# Patient Record
Sex: Female | Born: 1990 | Race: White | Hispanic: No | Marital: Married | Smoking: Never smoker
Health system: Southern US, Community
[De-identification: ages and names within clinical notes are randomized; demographics above are authoritative.]

## PROBLEM LIST (undated history)

## (undated) ENCOUNTER — Inpatient Hospital Stay (HOSPITAL_COMMUNITY): Payer: Self-pay

## (undated) DIAGNOSIS — O1490 Unspecified pre-eclampsia, unspecified trimester: Secondary | ICD-10-CM

## (undated) DIAGNOSIS — N390 Urinary tract infection, site not specified: Secondary | ICD-10-CM

---

## 2012-10-25 LAB — OB RESULTS CONSOLE ABO/RH: RH Type: POSITIVE

## 2012-10-25 LAB — OB RESULTS CONSOLE HEPATITIS B SURFACE ANTIGEN: Hepatitis B Surface Ag: NEGATIVE

## 2012-10-25 LAB — OB RESULTS CONSOLE HIV ANTIBODY (ROUTINE TESTING): HIV: NONREACTIVE

## 2013-02-13 DIAGNOSIS — O30039 Twin pregnancy, monochorionic/diamniotic, unspecified trimester: Secondary | ICD-10-CM | POA: Insufficient documentation

## 2013-02-26 ENCOUNTER — Other Ambulatory Visit: Payer: Self-pay

## 2013-03-17 ENCOUNTER — Encounter (HOSPITAL_COMMUNITY): Payer: Self-pay | Admitting: *Deleted

## 2013-03-17 ENCOUNTER — Inpatient Hospital Stay (HOSPITAL_COMMUNITY)
Admission: AD | Admit: 2013-03-17 | Discharge: 2013-03-17 | Disposition: A | Discharge status: Home / Self Care | Payer: Managed Care, Other (non HMO) | Source: Ambulatory Visit | Attending: Obstetrics and Gynecology | Admitting: Obstetrics and Gynecology

## 2013-03-17 DIAGNOSIS — O99891 Other specified diseases and conditions complicating pregnancy: Secondary | ICD-10-CM | POA: Insufficient documentation

## 2013-03-17 DIAGNOSIS — O212 Late vomiting of pregnancy: Secondary | ICD-10-CM | POA: Insufficient documentation

## 2013-03-17 DIAGNOSIS — O30009 Twin pregnancy, unspecified number of placenta and unspecified number of amniotic sacs, unspecified trimester: Secondary | ICD-10-CM | POA: Insufficient documentation

## 2013-03-17 DIAGNOSIS — R42 Dizziness and giddiness: Secondary | ICD-10-CM | POA: Insufficient documentation

## 2013-03-17 HISTORY — DX: Urinary tract infection, site not specified: N39.0

## 2013-03-17 LAB — URINALYSIS, ROUTINE W REFLEX MICROSCOPIC
Hgb urine dipstick: NEGATIVE
Protein, ur: NEGATIVE mg/dL
Urobilinogen, UA: 0.2 mg/dL (ref 0.0–1.0)

## 2013-03-17 MED ORDER — LACTATED RINGERS IV BOLUS (SEPSIS)
1000.0000 mL | Freq: Once | INTRAVENOUS | Status: AC
  Administered 2013-03-17: 1000 mL via INTRAVENOUS

## 2013-03-17 NOTE — MAU Provider Note (Signed)
History     CSN: [id number]  Arrival date and time: 03/17/13 1134   First Provider Initiated Contact with Patient 03/17/13 1222      Chief Complaint  Patient presents with  . Nausea   HPI 22 y.o. G1P0 at [redacted]w[redacted]d with twins sent from office for IV hydration. Pt states she has been feeling weak, dizzy and nauseous lately and describes some near syncopal episodes today. States she ate a good breakfast, hamburger for lunch. Hgb 12 in the office today. Denies pain or bleeding, + fetal movement. States she feels worse when the babies move and with any position changes.   Past Medical History  Diagnosis Date  . UTI (lower urinary tract infection)     History reviewed. No pertinent past surgical history.  History reviewed. No pertinent family history.  History  Substance Use Topics  . Smoking status: Never Smoker   . Smokeless tobacco: Never Used  . Alcohol Use: Not on file    Allergies: No Known Allergies  Prescriptions prior to admission  Medication Sig Dispense Refill  . ondansetron (ZOFRAN) 8 MG tablet Take 8 mg by mouth daily as needed for nausea.      . Prenatal Vit-Fe Fumarate-FA (PRENATAL MULTIVITAMIN) TABS Take 1 tablet by mouth daily at 12 noon.      . tolnaftate (TING) 1 % cream Apply 1 application topically 2 (two) times daily as needed (for athletes foot).        Review of Systems  Respiratory: Negative.   Cardiovascular: Negative.   Gastrointestinal: Negative for nausea, vomiting, abdominal pain, diarrhea and constipation.  Genitourinary: Negative for dysuria, urgency, frequency, hematuria and flank pain.       Negative for vaginal bleeding, cramping/contractions  Musculoskeletal: Negative.   Neurological: Positive for dizziness and weakness.  Psychiatric/Behavioral: Negative.    Physical Exam   Blood pressure 131/78, pulse 104, temperature 97.9 F (36.6 C), temperature source Oral, resp. rate 18, height 5' (1.524 m), weight 127 lb 12.8 oz (57.97 kg), last  menstrual period 08/11/2012.  Physical Exam  Nursing note and vitals reviewed. Constitutional: She is oriented to person, place, and time. She appears well-developed and well-nourished. No distress.  Cardiovascular: Normal rate.   Respiratory: Effort normal.  GI: Soft. There is no tenderness.  Musculoskeletal: Normal range of motion.  Neurological: She is alert and oriented to person, place, and time.  Skin: Skin is warm and dry.  Psychiatric: She has a normal mood and affect.   Reactive NST x 2, irregular UC MAU Course  Procedures  Results for orders placed during the hospital encounter of 03/17/13 (from the past 24 hour(s))  URINALYSIS, ROUTINE W REFLEX MICROSCOPIC     Status: Abnormal   Collection Time    03/17/13 12:46 PM      Result Value Range   Color, Urine YELLOW  YELLOW   APPearance CLEAR  CLEAR   Specific Gravity, Urine <1.005 (*) 1.005 - 1.030   pH 7.0  5.0 - 8.0   Glucose, UA NEGATIVE  NEGATIVE mg/dL   Hgb urine dipstick NEGATIVE  NEGATIVE   Bilirubin Urine NEGATIVE  NEGATIVE   Ketones, ur NEGATIVE  NEGATIVE mg/dL   Protein, ur NEGATIVE  NEGATIVE mg/dL   Urobilinogen, UA 0.2  0.0 - 1.0 mg/dL   Nitrite NEGATIVE  NEGATIVE   Leukocytes, UA NEGATIVE  NEGATIVE   IV hydration with 2 liters LR. Still feeling lightheaded.   Patient Vitals for the past 24 hrs:  BP Temp Temp src  Pulse Resp Height Weight  03/17/13 1440 110/72 mmHg - - 109 18 - -  03/17/13 1424 110/72 mmHg - - 109 - - -  03/17/13 1423 122/72 mmHg - - 102 - - -  03/17/13 1421 131/78 mmHg - - 104 18 - -  03/17/13 1158 118/78 mmHg 97.9 F (36.6 C) Oral 48 18 - -  03/17/13 1147 - - - - - 5' (1.524 m) 127 lb 12.8 oz (57.97 kg)    Assessment and Plan   1. Postural lightheadedness   2. Twin pregnancy, antepartum   Recommend compression stockings, frequent high protein snacks, aggressive PO hydration and rest. Call office if symptoms not improving over the next couple of days.     Medication List          ondansetron 8 MG tablet  Commonly known as:  ZOFRAN  Take 8 mg by mouth daily as needed for nausea.     prenatal multivitamin Tabs  Take 1 tablet by mouth daily at 12 noon.     TING 1 % cream  Generic drug:  tolnaftate  Apply 1 application topically 2 (two) times daily as needed (for athletes foot).            Follow-up Information   Follow up with GREWAL,MICHELLE L, MD. (as scheduled or sooner if symptoms do not improve)    Contact information:   [address] [place] [address] [phone]         FRAZIER,NATALIE 03/17/2013, 2:23 PM

## 2013-03-17 NOTE — MAU Note (Signed)
Patient was sent from the office for IVF's. States she has been nauseated and feeling dizzy.

## 2013-03-28 ENCOUNTER — Ambulatory Visit (INDEPENDENT_AMBULATORY_CARE_PROVIDER_SITE_OTHER): Payer: Managed Care, Other (non HMO) | Admitting: *Deleted

## 2013-03-28 VITALS — BP 117/78

## 2013-03-28 DIAGNOSIS — O30039 Twin pregnancy, monochorionic/diamniotic, unspecified trimester: Secondary | ICD-10-CM

## 2013-03-28 DIAGNOSIS — O30033 Twin pregnancy, monochorionic/diamniotic, third trimester: Secondary | ICD-10-CM

## 2013-03-28 DIAGNOSIS — O30009 Twin pregnancy, unspecified number of placenta and unspecified number of amniotic sacs, unspecified trimester: Secondary | ICD-10-CM

## 2013-03-28 NOTE — Progress Notes (Signed)
P = 100  Copy of report and tracing sent w/pt to Dr. Henderson Cloud today.

## 2013-04-04 ENCOUNTER — Other Ambulatory Visit: Payer: Managed Care, Other (non HMO)

## 2013-04-07 ENCOUNTER — Ambulatory Visit (INDEPENDENT_AMBULATORY_CARE_PROVIDER_SITE_OTHER): Payer: Managed Care, Other (non HMO) | Admitting: *Deleted

## 2013-04-07 DIAGNOSIS — O30009 Twin pregnancy, unspecified number of placenta and unspecified number of amniotic sacs, unspecified trimester: Secondary | ICD-10-CM

## 2013-04-15 ENCOUNTER — Other Ambulatory Visit: Payer: Managed Care, Other (non HMO)

## 2013-04-16 ENCOUNTER — Other Ambulatory Visit: Payer: Managed Care, Other (non HMO)

## 2013-04-16 ENCOUNTER — Ambulatory Visit (INDEPENDENT_AMBULATORY_CARE_PROVIDER_SITE_OTHER): Payer: Managed Care, Other (non HMO) | Admitting: *Deleted

## 2013-04-16 VITALS — BP 122/72

## 2013-04-16 DIAGNOSIS — O30009 Twin pregnancy, unspecified number of placenta and unspecified number of amniotic sacs, unspecified trimester: Secondary | ICD-10-CM

## 2013-04-16 LAB — OB RESULTS CONSOLE GBS: GBS: NEGATIVE

## 2013-04-16 NOTE — Progress Notes (Signed)
P = 79   Copy of report and tracing sent to Dr. Marcelle Overlie w/pt today.

## 2013-04-22 ENCOUNTER — Other Ambulatory Visit (HOSPITAL_COMMUNITY): Payer: Self-pay | Admitting: Obstetrics and Gynecology

## 2013-04-22 DIAGNOSIS — Z3689 Encounter for other specified antenatal screening: Secondary | ICD-10-CM

## 2013-04-23 ENCOUNTER — Encounter (HOSPITAL_COMMUNITY): Payer: Self-pay

## 2013-04-23 ENCOUNTER — Ambulatory Visit (HOSPITAL_COMMUNITY)
Admission: RE | Admit: 2013-04-23 | Discharge: 2013-04-23 | Disposition: A | Discharge status: Home / Self Care | Payer: Managed Care, Other (non HMO) | Source: Ambulatory Visit | Attending: Obstetrics and Gynecology | Admitting: Obstetrics and Gynecology

## 2013-04-23 ENCOUNTER — Other Ambulatory Visit (HOSPITAL_COMMUNITY): Payer: Self-pay | Admitting: Obstetrics and Gynecology

## 2013-04-23 ENCOUNTER — Other Ambulatory Visit: Payer: Managed Care, Other (non HMO)

## 2013-04-23 VITALS — BP 130/82 | HR 87 | Wt 139.0 lb

## 2013-04-23 DIAGNOSIS — O30039 Twin pregnancy, monochorionic/diamniotic, unspecified trimester: Secondary | ICD-10-CM | POA: Insufficient documentation

## 2013-04-23 DIAGNOSIS — Z3689 Encounter for other specified antenatal screening: Secondary | ICD-10-CM | POA: Insufficient documentation

## 2013-04-23 DIAGNOSIS — O30009 Twin pregnancy, unspecified number of placenta and unspecified number of amniotic sacs, unspecified trimester: Secondary | ICD-10-CM | POA: Insufficient documentation

## 2013-04-23 IMAGING — US US OB COMP +14 WK
1 series · 14 of 28 positions shown · non-contrast
Comparison: none

[Series 1: us ob comp +14 wk · 0.19mm/px · 85 acquisitions, 14 frames shown]
[im 4/85]
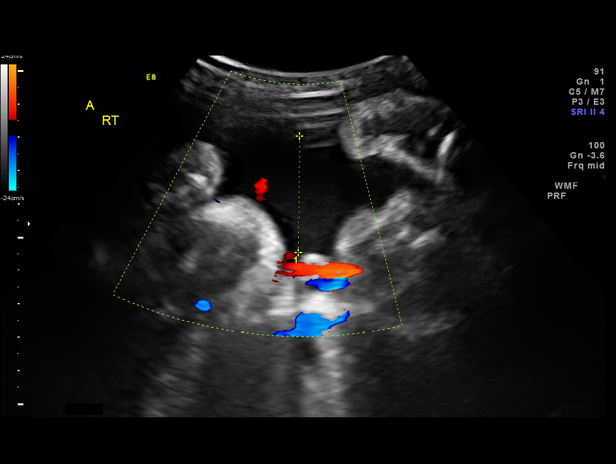
[im 10/85]
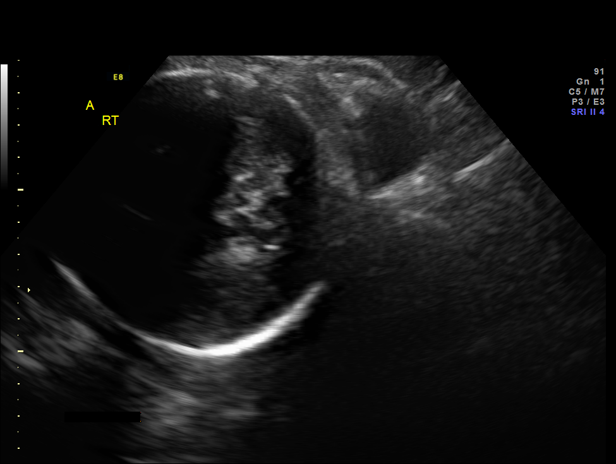
[im 16/85]
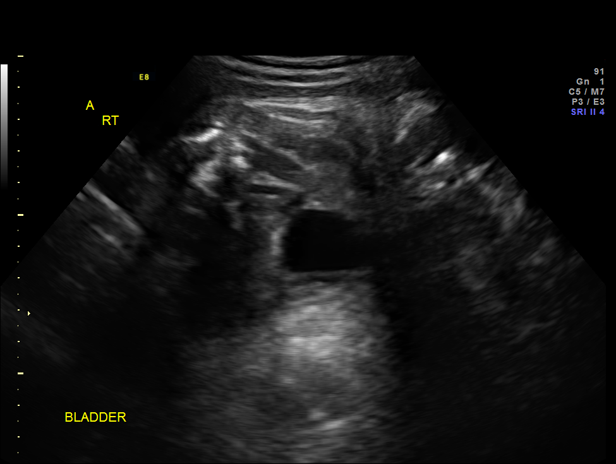
[im 22/85]
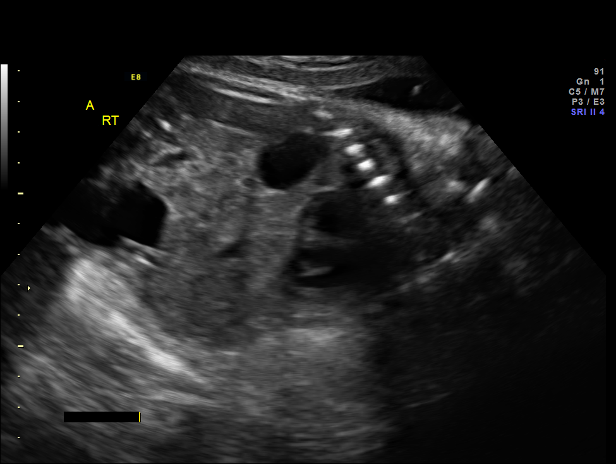
[im 29/85]
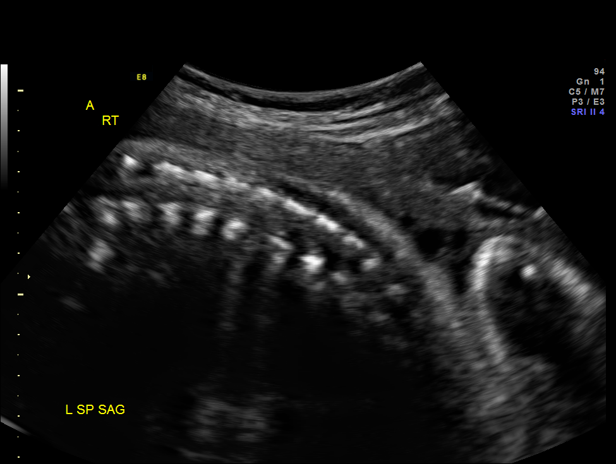
[im 35/85]
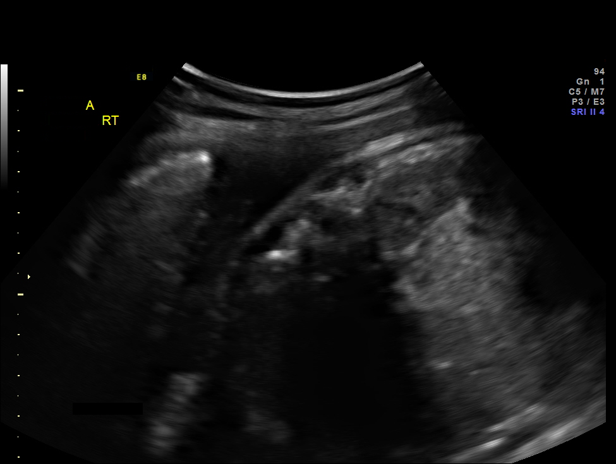
[im 41/85]
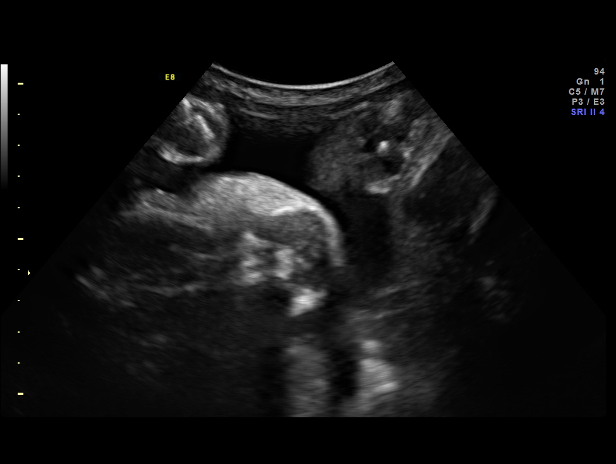
[im 47/85]
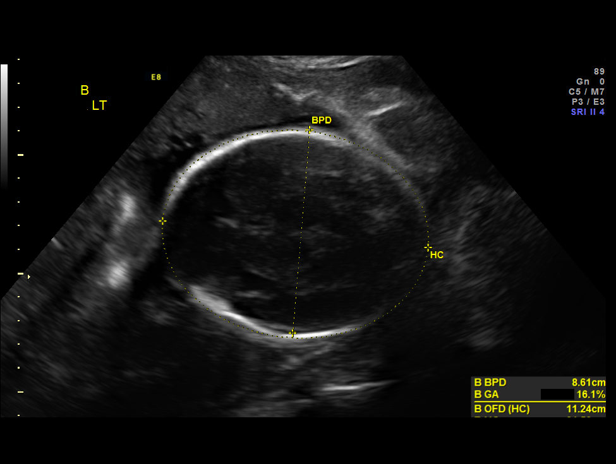
[im 53/85]
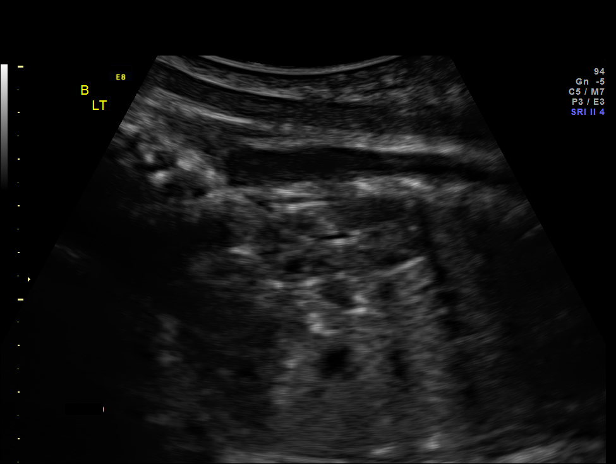
[im 60/85]
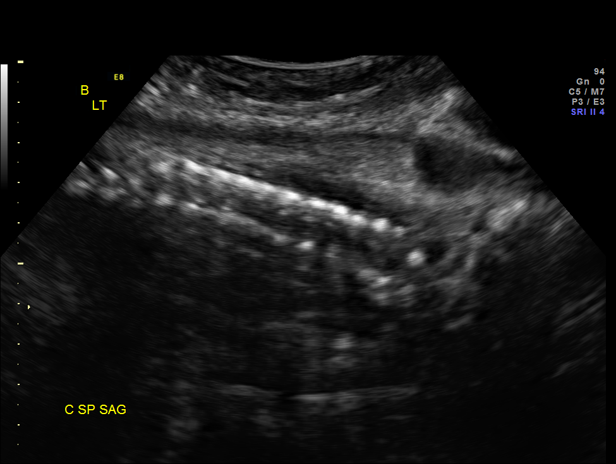
[im 66/85]
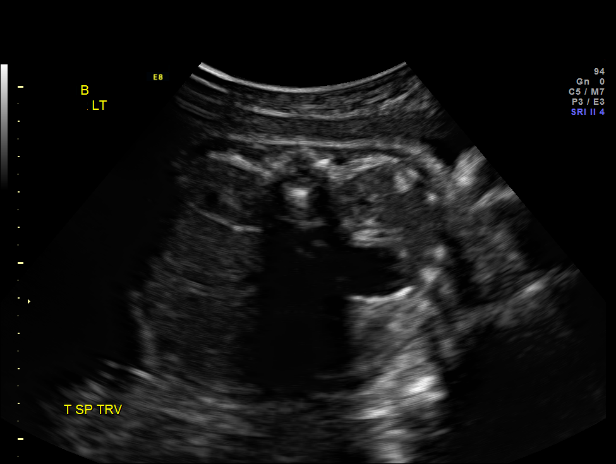
[im 72/85]
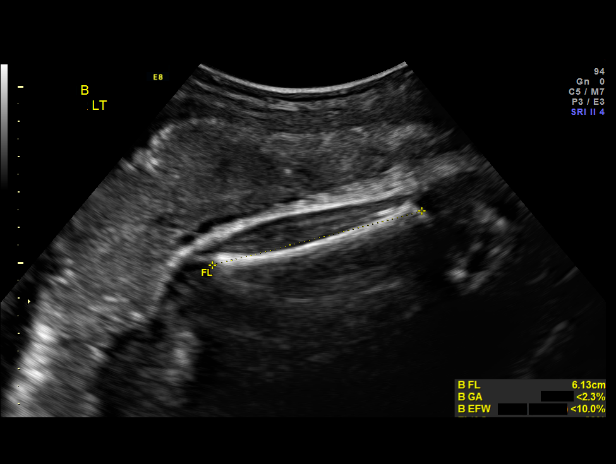
[im 78/85]
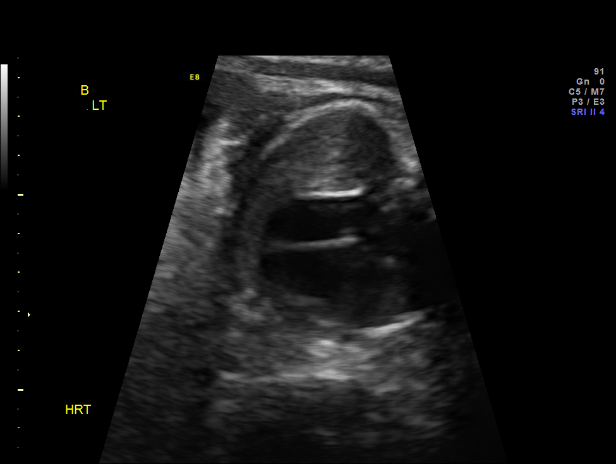
[im 85/85]
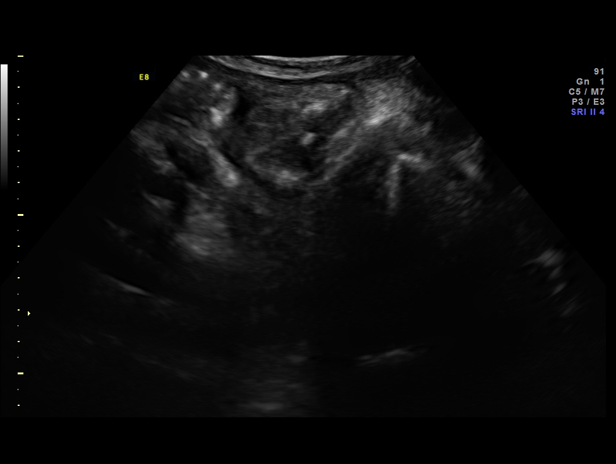

[14 of 28 positions shown; findings below may reference images not displayed]

OBSTETRICS REPORT
                      (Signed Final [DATE] [DATE])

Service(s) Provided

 US OB COMP + 14 WK                                    76805.1
 US OB COMP ADDL GEST + 14 WK                          76810.1
 US UA CORD DOPPLER                                    76820.0
 US UA ADDL GEST                                       76820.1
Indications

 Basic anatomic survey                                 [[place]]
 MIYANUI pregnancy, twins discordant,
 antepartum
Fetal Evaluation (Fetus A)

 Num Of Fetuses:    2
 Fetal Heart Rate:  144                          bpm
 Cardiac Activity:  Observed
 Fetal Lie:         Lower right Fetus
 Presentation:      Cephalic
 Placenta:          Fundal

 Membrane Desc:     Dividing
                    Membrane seen

 Amniotic Fluid
 AFI FV:      Subjectively within normal limits
                                             Larg Pckt:     3.5  cm
Biometry (Fetus A)

 BPD:     87.3  mm     G. Age:  35w 2d                CI:         86.7   70 - 86
 OFD:    100.7  mm                                    FL/HC:      20.9   20.1 -

 HC:     301.3  mm     G. Age:  33w 3d      < 3  %    HC/AC:      0.95   0.93 -

 AC:     316.8  mm     G. Age:  35w 4d       38  %    FL/BPD:     72.1   71 - 87
 FL:      62.9  mm     G. Age:  32w 4d      < 3  %    FL/AC:      19.9   20 - 24
 HUM:     58.4  mm     G. Age:  33w 6d       20  %

 Est. FW:    [WV]  gm      5 lb 7 oz     22  %     FW Discordancy     0 \ 19 %
Gestational Age (Fetus A)

 LMP:           36w 3d        Date:  [DATE]                 EDD:   [DATE]
 U/S Today:     34w 2d                                        EDD:   [DATE]
 Best:          36w 3d     Det. By:  LMP  ([DATE])          EDD:   [DATE]
Anatomy (Fetus A)

 Cranium:          Appears normal         Diaphragm:        Appears normal
 Fetal Cavum:      Not well visualized    Stomach:          Appears normal
 Ventricles:       Appears normal         Abdomen:          Appears normal
 Choroid Plexus:   Appears normal         Abdominal Wall:   Not well visualized
 Cerebellum:       Not well visualized    Cord Vessels:     Appears normal (3
                                                            vessel cord)
 Posterior Fossa:  Not well visualized    Kidneys:          Appear normal
 Nuchal Fold:      Not applicable (>20    Bladder:          Appears normal
                   wks GA)
 Face:             Not well visualized    Spine:            Limited views
                                                            appear normal
 Lips:             Not well visualized    Lower             Not well visualized
                                          Extremities:
 Heart:            Not well visualized    Upper             Not well visualized
                                          Extremities:
 Aortic Arch:      Appears normal

 Other:  Technically difficult due to advanced GA and fetal position.
Doppler - Fetal Vessels (Fetus A)

 Umbilical Artery
 S/D:   2.55           59  %tile       RI:
 PI:    0.91                           PSV:       49.37   cm/s
 Umbilical Artery
 Absent DFV:    No     Reverse DFV:    No

Fetal Evaluation (Fetus B)

 Num Of Fetuses:    2
 Fetal Heart Rate:  138                          bpm
 Cardiac Activity:  Observed
 Fetal Lie:         Upper left Fetus
 Presentation:      Cephalic
 Placenta:          Fundal

 Membrane Desc:     Dividing
                    Membrane seen

 Amniotic Fluid
 AFI FV:      Subjectively within normal limits
                                             Larg Pckt:     3.0  cm
Biometry (Fetus B)

 BPD:     86.1  mm     G. Age:  34w 5d                CI:         76.1   70 - 86
 OFD:    113.2  mm                                    FL/HC:      19.6   20.1 -

 HC:     316.7  mm     G. Age:  35w 4d        9  %    HC/AC:      1.14   0.93 -

 AC:       277  mm     G. Age:  31w 6d      < 3  %    FL/BPD:     72.2   71 - 87
 FL:      62.2  mm     G. Age:  32w 1d      < 3  %    FL/AC:      22.5   20 - 24
 HUM:     56.5  mm     G. Age:  32w 6d      < 5  %

 Est. FW:    [WV]  gm      4 lb 6 oz   < 10  %     FW Discordancy        19  %
Gestational Age (Fetus B)

 LMP:           36w 3d        Date:  [DATE]                 EDD:   [DATE]
 U/S Today:     33w 4d                                        EDD:   [DATE]
 Best:          36w 3d     Det. By:  LMP  ([DATE])          EDD:   [DATE]
Anatomy (Fetus B)

 Cranium:          Appears normal         Diaphragm:        Not well visualized
 Fetal Cavum:      Appears normal         Stomach:          Appears normal
 Ventricles:       Not well visualized    Abdomen:          Appears normal
 Choroid Plexus:   Not well visualized    Abdominal Wall:   Not well visualized
 Cerebellum:       Not well visualized    Cord Vessels:     Not well visualized
 Posterior Fossa:  Not well visualized    Kidneys:          Appear normal
 Nuchal Fold:      Not applicable (>20    Bladder:          Appears normal
                   wks GA)
 Face:             Not well visualized    Spine:            Limited views
                                                            appear normal
 Lips:             Not well visualized    Lower             Not well visualized
                                          Extremities:
 Heart:            Not well visualized    Upper             Not well visualized
                                          Extremities:
 Aortic Arch:      Appears normal

 Other:  Technically difficult due to advanced GA and fetal position.
Doppler - Fetal Vessels (Fetus B)

 Umbilical Artery
 S/D:   2.24           41  %tile       RI:
 PI:    0.81                           PSV:       37.27   cm/s
 Umbilical Artery
 Absent DFV:    No     Reverse DFV:    No

Cervix Uterus Adnexa

 Cervix:       Not visualized (advanced GA >[WV])
Impression

 MC/DA twins with best dates of 36 [DATE] weeks
 19% intertwin growth discordance is noted

 Twin A:
 Maternal right, cephalic presentaton, fundal placenta
 Fetal growth is appropriate (22nd %tile)
 Limited views of the fetal anatomy were obtained due to late
 gestational age
 Normal UA Doppler studies for gestational age
 Normal amniotic fluid volume

 Twin B:
 Maternal left, cephalic presentation, fundal placenta
 Estimated fetal weight < 10th %tile; AC < 3rd %tile
 Limited views of the fetal anatomy obtained due to late
 gestational age
 Normal UA Doppler studies for gestational age
 Normal amniotic fluid volume
 Reactive NST x 2
Recommendations

 Recommend induction of labor due to MC/DA twins with
 growth restriction of Twin B.
 Recommendations discussed with Dr. MIYANUI who will
 arrange delivery within the next several days.

 questions or concerns.

## 2013-04-23 NOTE — Progress Notes (Signed)
Terry Paul  was seen today for an ultrasound appointment.  See full report in [facility].  Impression: MC/DA twins with best dates of 36 3/7 weeks 19% intertwin growth discordance is noted  Twin A: Maternal right, cephalic presentaton, fundal placenta Fetal growth is appropriate (22nd %tile) Limited views of the fetal anatomy were obtained due to late gestational age Normal UA Doppler studies for gestational age Normal amniotic fluid volume  Twin B: Maternal left, cephalic presentation, fundal placenta Estimated fetal weight < 10th %tile; AC < 3rd %tile Limited views of the fetal anatomy obtained due to late gestational age Normal UA Doppler studies for gestational age Normal amniotic fluid volume  Reactive NST x 2  Recommendations: Recommend induction of labor due to MC/DA twins with growth restriction of Twin B. Recommendations discussed with Dr. Marcelle Overlie who will arrange delivery within the next several days  Alpha Gula, MD

## 2013-04-24 ENCOUNTER — Inpatient Hospital Stay (HOSPITAL_COMMUNITY)
Admission: RE | Admit: 2013-04-24 | Discharge: 2013-05-01 | DRG: 765 | Disposition: A | Discharge status: Home / Self Care | Payer: Managed Care, Other (non HMO) | Source: Ambulatory Visit | Attending: Obstetrics and Gynecology | Admitting: Obstetrics and Gynecology

## 2013-04-24 ENCOUNTER — Encounter (HOSPITAL_COMMUNITY): Payer: Self-pay

## 2013-04-24 DIAGNOSIS — O30039 Twin pregnancy, monochorionic/diamniotic, unspecified trimester: Secondary | ICD-10-CM

## 2013-04-24 DIAGNOSIS — O30009 Twin pregnancy, unspecified number of placenta and unspecified number of amniotic sacs, unspecified trimester: Principal | ICD-10-CM | POA: Diagnosis present

## 2013-04-24 DIAGNOSIS — O30003 Twin pregnancy, unspecified number of placenta and unspecified number of amniotic sacs, third trimester: Secondary | ICD-10-CM | POA: Diagnosis present

## 2013-04-24 DIAGNOSIS — O365990 Maternal care for other known or suspected poor fetal growth, unspecified trimester, not applicable or unspecified: Secondary | ICD-10-CM | POA: Diagnosis present

## 2013-04-24 DIAGNOSIS — Z98891 History of uterine scar from previous surgery: Secondary | ICD-10-CM

## 2013-04-24 LAB — CBC
MCH: 31.4 pg (ref 26.0–34.0)
MCHC: 35.3 g/dL (ref 30.0–36.0)
Platelets: 148 10*3/uL — ABNORMAL LOW (ref 150–400)
RDW: 13.3 % (ref 11.5–15.5)

## 2013-04-24 MED ORDER — LIDOCAINE HCL (PF) 1 % IJ SOLN: 30.0000 mL | INTRAMUSCULAR | Status: DC | PRN

## 2013-04-24 MED ORDER — ACETAMINOPHEN 325 MG PO TABS
650.0000 mg | ORAL_TABLET | ORAL | Status: DC | PRN
  Administered 2013-04-25: 650 mg via ORAL
  Filled 2013-04-24 (×2): qty 1

## 2013-04-24 MED ORDER — OXYCODONE-ACETAMINOPHEN 5-325 MG PO TABS: 1.0000 | ORAL_TABLET | ORAL | Status: DC | PRN

## 2013-04-24 MED ORDER — LACTATED RINGERS IV SOLN
INTRAVENOUS | Status: DC
  Administered 2013-04-24 – 2013-04-26 (×5): via INTRAVENOUS

## 2013-04-24 MED ORDER — LACTATED RINGERS IV SOLN: 500.0000 mL | INTRAVENOUS | Status: DC | PRN

## 2013-04-24 MED ORDER — FLEET ENEMA 7-19 GM/118ML RE ENEM: 1.0000 | ENEMA | RECTAL | Status: DC | PRN

## 2013-04-24 MED ORDER — ZOLPIDEM TARTRATE 5 MG PO TABS
5.0000 mg | ORAL_TABLET | Freq: Every evening | ORAL | Status: DC | PRN
  Filled 2013-04-24: qty 1

## 2013-04-24 MED ORDER — OXYTOCIN 40 UNITS IN LACTATED RINGERS INFUSION - SIMPLE MED
62.5000 mL/h | INTRAVENOUS | Status: DC
  Filled 2013-04-24: qty 1000

## 2013-04-24 MED ORDER — TERBUTALINE SULFATE 1 MG/ML IJ SOLN: 0.2500 mg | Freq: Once | INTRAMUSCULAR | Status: AC | PRN

## 2013-04-24 MED ORDER — MISOPROSTOL 25 MCG QUARTER TABLET
25.0000 ug | ORAL_TABLET | ORAL | Status: DC | PRN
  Administered 2013-04-24 – 2013-04-25 (×2): 25 ug via VAGINAL
  Filled 2013-04-24 (×2): qty 0.25

## 2013-04-24 MED ORDER — ONDANSETRON HCL 4 MG/2ML IJ SOLN: 4.0000 mg | Freq: Four times a day (QID) | INTRAMUSCULAR | Status: DC | PRN

## 2013-04-24 MED ORDER — OXYTOCIN BOLUS FROM INFUSION: 500.0000 mL | INTRAVENOUS | Status: DC

## 2013-04-24 MED ORDER — CITRIC ACID-SODIUM CITRATE 334-500 MG/5ML PO SOLN
30.0000 mL | ORAL | Status: DC | PRN
  Administered 2013-04-26: 30 mL via ORAL
  Filled 2013-04-24: qty 15

## 2013-04-24 MED ORDER — IBUPROFEN 600 MG PO TABS: 600.0000 mg | ORAL_TABLET | Freq: Four times a day (QID) | ORAL | Status: DC | PRN

## 2013-04-25 LAB — RPR: RPR Ser Ql: NONREACTIVE

## 2013-04-25 MED ORDER — LACTATED RINGERS IV SOLN
500.0000 mL | Freq: Once | INTRAVENOUS | Status: AC
  Administered 2013-04-25: 500 mL via INTRAVENOUS

## 2013-04-25 MED ORDER — EPHEDRINE 5 MG/ML INJ
10.0000 mg | INTRAVENOUS | Status: DC | PRN
Start: 2013-04-25 — End: 2013-04-26
  Filled 2013-04-25: qty 4

## 2013-04-25 MED ORDER — OXYTOCIN 40 UNITS IN LACTATED RINGERS INFUSION - SIMPLE MED
1.0000 m[IU]/min | INTRAVENOUS | Status: DC
  Administered 2013-04-25: 2 m[IU]/min via INTRAVENOUS

## 2013-04-25 MED ORDER — EPHEDRINE 5 MG/ML INJ: 10.0000 mg | INTRAVENOUS | Status: DC | PRN

## 2013-04-25 MED ORDER — PHENYLEPHRINE 40 MCG/ML (10ML) SYRINGE FOR IV PUSH (FOR BLOOD PRESSURE SUPPORT): 80.0000 ug | PREFILLED_SYRINGE | INTRAVENOUS | Status: DC | PRN

## 2013-04-25 MED ORDER — PHENYLEPHRINE 40 MCG/ML (10ML) SYRINGE FOR IV PUSH (FOR BLOOD PRESSURE SUPPORT)
80.0000 ug | PREFILLED_SYRINGE | INTRAVENOUS | Status: DC | PRN
Start: 2013-04-25 — End: 2013-04-26
  Filled 2013-04-25: qty 5

## 2013-04-25 MED ORDER — FENTANYL 2.5 MCG/ML BUPIVACAINE 1/10 % EPIDURAL INFUSION (WH - ANES)
14.0000 mL/h | INTRAMUSCULAR | Status: DC | PRN
  Filled 2013-04-25: qty 125

## 2013-04-25 MED ORDER — TERBUTALINE SULFATE 1 MG/ML IJ SOLN: 0.2500 mg | Freq: Once | INTRAMUSCULAR | Status: AC | PRN

## 2013-04-25 MED ORDER — DIPHENHYDRAMINE HCL 50 MG/ML IJ SOLN: 12.5000 mg | INTRAMUSCULAR | Status: DC | PRN

## 2013-04-25 NOTE — Progress Notes (Signed)
Pt comfortable, feeling mild discomfort with ctx Pitocin  FHT reassuring x 2 with accels Toco Q1 Cvx FT/high  A/P:  Twins Exp mngt

## 2013-04-25 NOTE — Progress Notes (Signed)
Pt comfortable.  Mild discomfort w/ ctx, declines epidural Pitocin  FHT reassuring x 2 toco Q1-2 Cvx FT, unchanged  A/P:  Continue pitocin Exp mngt Plan of care reviewed with pt and family

## 2013-04-25 NOTE — Progress Notes (Signed)
Dr Renaldo Fiddler notifed of FHr tracing, SVE, POC, pain level, Bps, and difficulty of tracing FHRs. In to assess patient

## 2013-04-25 NOTE — H&P (Signed)
Terry Paul is a 22 y.o. female presenting for IOL.  Mono/di twins complicated by discordant growth.  [place] with MFM 2 days ago & recommended delivery. History OB History   Grav Para Term Preterm Abortions TAB SAB Ect Mult Living   1              Past Medical History  Diagnosis Date  . UTI (lower urinary tract infection)    History reviewed. No pertinent past surgical history. Family History: family history is not on file. Social History:  reports that she has never smoked. She has never used smokeless tobacco. She reports that she does not use illicit drugs. Her alcohol history is not on file.   Prenatal Transfer Tool  Maternal Diabetes: No Genetic Screening: Declined Maternal Ultrasounds/Referrals: Abnormal:  Findings:   Other:  Discordant growth Fetal Ultrasounds or other Referrals:  Referred to Materal Fetal Medicine  Maternal Substance Abuse:  No Significant Maternal Medications:  None Significant Maternal Lab Results:  None Other Comments:  None  ROS  Dilation: 1 Effacement (%): Thick Station: -1 Exam by:: N Psychologist, counselling Blood pressure 134/89, pulse 91, temperature 97.9 F (36.6 C), temperature source Oral, resp. rate 18, height 5' (1.524 m), weight 63.05 kg (139 lb), last menstrual period 08/11/2012. Exam Physical Exam  Prenatal labs: ABO, Rh: --/--/O POS (08/07 2220) Antibody: NEG (08/07 2220) Rubella: Equivocal (02/07 0000) RPR: NON REACTIVE (08/07 2020)  HBsAg: Negative (02/07 0000)  HIV: Non-reactive (02/07 0000)  GBS: Negative (07/30 0000)   Assessment/Plan: Admit Cytotec/pitocin IOL Rec epidural anesthesia, continuous monitoring, and delivery in the OR Discussed with patient potential for c-section for both A&B or for malpresentation/distress of B after delivery of A   Aadarsh Cozort 04/25/2013, 9:48 AM

## 2013-04-25 NOTE — Progress Notes (Signed)
Dr Renaldo Fiddler notified of pt pain level, Fhr tracing, difficulty tracing Baby "B," UC freq, amt of pitocin. Order to decrease pit to 1 mmu

## 2013-04-25 NOTE — Progress Notes (Signed)
At bedside educating patient on the importance of tracing FHR while on pitocin. Patient frustrated by constant readjustment. Encouraged to find a comfortable position and trace.

## 2013-04-25 NOTE — Progress Notes (Signed)
Pt starting to feel some increase intensity with contractions.  No pain. On pitocin  FHT - reassuring x 2 Toco Q1-2 Cvx 1/80/-2, unable to arom  A/P:  Twins, IOL Continue pitocin.

## 2013-04-25 NOTE — Progress Notes (Signed)
Pt called out to ask about about slightly greater swelling in L leg than R leg; lung sounds clear, O2 sats 100%; RROB RN & MD notified - Elana Alm RNC

## 2013-04-26 ENCOUNTER — Inpatient Hospital Stay (HOSPITAL_COMMUNITY): Payer: Managed Care, Other (non HMO) | Admitting: Anesthesiology

## 2013-04-26 ENCOUNTER — Encounter (HOSPITAL_COMMUNITY): Payer: Self-pay | Admitting: Anesthesiology

## 2013-04-26 ENCOUNTER — Encounter (HOSPITAL_COMMUNITY)
Admission: RE | Disposition: A | Discharge status: Home / Self Care | Payer: Self-pay | Source: Ambulatory Visit | Attending: Obstetrics and Gynecology

## 2013-04-26 ENCOUNTER — Encounter (HOSPITAL_COMMUNITY): Payer: Self-pay

## 2013-04-26 LAB — COMPREHENSIVE METABOLIC PANEL
ALT: 8 U/L (ref 0–35)
AST: 28 U/L (ref 0–37)
Calcium: 8.5 mg/dL (ref 8.4–10.5)
GFR calc Af Amer: >90 mL/min (ref >90)
Sodium: 136 mEq/L (ref 135–145)
Total Protein: 4.4 g/dL — ABNORMAL LOW (ref 6.0–8.3)

## 2013-04-26 LAB — CBC
MCH: 31.9 pg (ref 26.0–34.0)
MCHC: 35.8 g/dL (ref 30.0–36.0)
Platelets: 130 10*3/uL — ABNORMAL LOW (ref 150–400)

## 2013-04-26 SURGERY — Surgical Case
Anesthesia: Spinal | Site: Abdomen | Wound class: Clean Contaminated

## 2013-04-26 MED ORDER — NALOXONE HCL 0.4 MG/ML IJ SOLN: 0.4000 mg | INTRAMUSCULAR | Status: DC | PRN

## 2013-04-26 MED ORDER — MENTHOL 3 MG MT LOZG: 1.0000 | LOZENGE | OROMUCOSAL | Status: DC | PRN

## 2013-04-26 MED ORDER — PROMETHAZINE HCL 25 MG/ML IJ SOLN: 6.2500 mg | INTRAMUSCULAR | Status: DC | PRN

## 2013-04-26 MED ORDER — SODIUM CHLORIDE 0.9 % IJ SOLN: 3.0000 mL | INTRAMUSCULAR | Status: DC | PRN

## 2013-04-26 MED ORDER — NALBUPHINE HCL 10 MG/ML IJ SOLN
5.0000 mg | INTRAMUSCULAR | Status: DC | PRN
  Filled 2013-04-26: qty 1

## 2013-04-26 MED ORDER — DIPHENHYDRAMINE HCL 25 MG PO CAPS: 25.0000 mg | ORAL_CAPSULE | Freq: Four times a day (QID) | ORAL | Status: DC | PRN

## 2013-04-26 MED ORDER — SCOPOLAMINE 1 MG/3DAYS TD PT72: 1.0000 | MEDICATED_PATCH | Freq: Once | TRANSDERMAL | Status: AC

## 2013-04-26 MED ORDER — SIMETHICONE 80 MG PO CHEW
80.0000 mg | CHEWABLE_TABLET | Freq: Three times a day (TID) | ORAL | Status: DC
  Administered 2013-04-26 – 2013-04-30 (×12): 80 mg via ORAL

## 2013-04-26 MED ORDER — CEFAZOLIN SODIUM-DEXTROSE 2-3 GM-% IV SOLR
2.0000 g | INTRAVENOUS | Status: AC
  Administered 2013-04-26: 2 g via INTRAVENOUS
  Filled 2013-04-26: qty 50

## 2013-04-26 MED ORDER — LACTATED RINGERS IV SOLN
INTRAVENOUS | Status: DC | PRN
  Administered 2013-04-26 (×2): via INTRAVENOUS

## 2013-04-26 MED ORDER — MORPHINE SULFATE (PF) 0.5 MG/ML IJ SOLN
INTRAMUSCULAR | Status: DC | PRN
  Administered 2013-04-26: .1 mg via INTRATHECAL

## 2013-04-26 MED ORDER — KETOROLAC TROMETHAMINE 60 MG/2ML IM SOLN
INTRAMUSCULAR | Status: AC
  Administered 2013-04-26: 60 mg via INTRAMUSCULAR
  Filled 2013-04-26: qty 2

## 2013-04-26 MED ORDER — WITCH HAZEL-GLYCERIN EX PADS: 1.0000 "application " | MEDICATED_PAD | CUTANEOUS | Status: DC | PRN

## 2013-04-26 MED ORDER — METOCLOPRAMIDE HCL 5 MG/ML IJ SOLN: 10.0000 mg | Freq: Three times a day (TID) | INTRAMUSCULAR | Status: DC | PRN

## 2013-04-26 MED ORDER — MEPERIDINE HCL 25 MG/ML IJ SOLN: 6.2500 mg | INTRAMUSCULAR | Status: DC | PRN

## 2013-04-26 MED ORDER — DIPHENHYDRAMINE HCL 25 MG PO CAPS: 25.0000 mg | ORAL_CAPSULE | ORAL | Status: DC | PRN

## 2013-04-26 MED ORDER — ONDANSETRON HCL 4 MG/2ML IJ SOLN
INTRAMUSCULAR | Status: DC | PRN
  Administered 2013-04-26: 4 mg via INTRAVENOUS

## 2013-04-26 MED ORDER — LANOLIN HYDROUS EX OINT: 1.0000 "application " | TOPICAL_OINTMENT | CUTANEOUS | Status: DC | PRN

## 2013-04-26 MED ORDER — MAGNESIUM SULFATE 40 G IN LACTATED RINGERS - SIMPLE
2.0000 g/h | INTRAVENOUS | Status: DC
  Administered 2013-04-26: 2 g/h via INTRAVENOUS
  Filled 2013-04-26: qty 500

## 2013-04-26 MED ORDER — HYDROMORPHONE HCL PF 1 MG/ML IJ SOLN
INTRAMUSCULAR | Status: AC
  Administered 2013-04-26: 0.25 mg via INTRAVENOUS
  Filled 2013-04-26: qty 1

## 2013-04-26 MED ORDER — ONDANSETRON HCL 4 MG/2ML IJ SOLN
INTRAMUSCULAR | Status: AC
  Filled 2013-04-26: qty 2

## 2013-04-26 MED ORDER — MAGNESIUM SULFATE BOLUS VIA INFUSION
4.0000 g | Freq: Once | INTRAVENOUS | Status: AC
  Administered 2013-04-26: 4 g via INTRAVENOUS
  Filled 2013-04-26: qty 500

## 2013-04-26 MED ORDER — OXYTOCIN 40 UNITS IN LACTATED RINGERS INFUSION - SIMPLE MED: 62.5000 mL/h | INTRAVENOUS | Status: AC

## 2013-04-26 MED ORDER — PRENATAL MULTIVITAMIN CH
1.0000 | ORAL_TABLET | Freq: Every day | ORAL | Status: DC
  Administered 2013-04-27 – 2013-05-01 (×5): 1 via ORAL
  Filled 2013-04-26 (×5): qty 1

## 2013-04-26 MED ORDER — KETOROLAC TROMETHAMINE 30 MG/ML IJ SOLN: 30.0000 mg | Freq: Four times a day (QID) | INTRAMUSCULAR | Status: AC | PRN

## 2013-04-26 MED ORDER — OXYCODONE-ACETAMINOPHEN 5-325 MG PO TABS: 1.0000 | ORAL_TABLET | ORAL | Status: DC | PRN

## 2013-04-26 MED ORDER — MORPHINE SULFATE 0.5 MG/ML IJ SOLN
INTRAMUSCULAR | Status: AC
  Filled 2013-04-26: qty 10

## 2013-04-26 MED ORDER — EPHEDRINE 5 MG/ML INJ
INTRAVENOUS | Status: AC
  Filled 2013-04-26: qty 10

## 2013-04-26 MED ORDER — SCOPOLAMINE 1 MG/3DAYS TD PT72
MEDICATED_PATCH | TRANSDERMAL | Status: AC
  Administered 2013-04-26: 1.5 mg via TRANSDERMAL
  Filled 2013-04-26: qty 1

## 2013-04-26 MED ORDER — DIBUCAINE 1 % RE OINT: 1.0000 "application " | TOPICAL_OINTMENT | RECTAL | Status: DC | PRN

## 2013-04-26 MED ORDER — IBUPROFEN 600 MG PO TABS
600.0000 mg | ORAL_TABLET | Freq: Four times a day (QID) | ORAL | Status: DC
  Administered 2013-04-27 – 2013-05-01 (×17): 600 mg via ORAL
  Filled 2013-04-26 (×18): qty 1

## 2013-04-26 MED ORDER — TETANUS-DIPHTH-ACELL PERTUSSIS 5-2.5-18.5 LF-MCG/0.5 IM SUSP
0.5000 mL | Freq: Once | INTRAMUSCULAR | Status: DC
  Filled 2013-04-26: qty 0.5

## 2013-04-26 MED ORDER — SODIUM CHLORIDE 0.9 % IJ SOLN
INTRAMUSCULAR | Status: AC
  Filled 2013-04-26: qty 3

## 2013-04-26 MED ORDER — PHENYLEPHRINE HCL 10 MG/ML IJ SOLN
INTRAMUSCULAR | Status: DC | PRN
  Administered 2013-04-26 (×2): 80 ug via INTRAVENOUS

## 2013-04-26 MED ORDER — CEFAZOLIN SODIUM-DEXTROSE 1-4 GM-% IV SOLR
1.0000 g | Freq: Once | INTRAVENOUS | Status: AC
  Administered 2013-04-26: 1 g via INTRAVENOUS
  Filled 2013-04-26: qty 50

## 2013-04-26 MED ORDER — OXYTOCIN 10 UNIT/ML IJ SOLN
INTRAMUSCULAR | Status: AC
  Filled 2013-04-26: qty 4

## 2013-04-26 MED ORDER — KETOROLAC TROMETHAMINE 60 MG/2ML IM SOLN: 60.0000 mg | Freq: Once | INTRAMUSCULAR | Status: AC | PRN

## 2013-04-26 MED ORDER — MEDROXYPROGESTERONE ACETATE 150 MG/ML IM SUSP: 150.0000 mg | INTRAMUSCULAR | Status: DC | PRN

## 2013-04-26 MED ORDER — DEXTROSE IN LACTATED RINGERS 5 % IV SOLN
INTRAVENOUS | Status: DC
  Administered 2013-04-26: 1 mL via INTRAVENOUS
  Administered 2013-04-27: 05:00:00 via INTRAVENOUS

## 2013-04-26 MED ORDER — DIPHENHYDRAMINE HCL 50 MG/ML IJ SOLN: 12.5000 mg | INTRAMUSCULAR | Status: DC | PRN

## 2013-04-26 MED ORDER — CEFAZOLIN SODIUM 1-5 GM-% IV SOLN: 1.0000 g | Freq: Once | INTRAVENOUS | Status: DC

## 2013-04-26 MED ORDER — HYDROMORPHONE HCL PF 1 MG/ML IJ SOLN
0.2500 mg | INTRAMUSCULAR | Status: DC | PRN
  Administered 2013-04-26: 0.25 mg via INTRAVENOUS

## 2013-04-26 MED ORDER — ONDANSETRON HCL 4 MG PO TABS: 4.0000 mg | ORAL_TABLET | ORAL | Status: DC | PRN

## 2013-04-26 MED ORDER — FENTANYL CITRATE 0.05 MG/ML IJ SOLN
INTRAMUSCULAR | Status: AC
  Filled 2013-04-26: qty 2

## 2013-04-26 MED ORDER — ONDANSETRON HCL 4 MG/2ML IJ SOLN: 4.0000 mg | INTRAMUSCULAR | Status: DC | PRN

## 2013-04-26 MED ORDER — DIPHENHYDRAMINE HCL 50 MG/ML IJ SOLN: 25.0000 mg | INTRAMUSCULAR | Status: DC | PRN

## 2013-04-26 MED ORDER — PROMETHAZINE HCL 25 MG/ML IJ SOLN
INTRAMUSCULAR | Status: AC
  Administered 2013-04-26: 6.25 mg via INTRAVENOUS
  Filled 2013-04-26: qty 1

## 2013-04-26 MED ORDER — BUPIVACAINE IN DEXTROSE 0.75-8.25 % IT SOLN
INTRATHECAL | Status: DC | PRN
  Administered 2013-04-26: 1.3 mL via INTRATHECAL

## 2013-04-26 MED ORDER — PHENYLEPHRINE 40 MCG/ML (10ML) SYRINGE FOR IV PUSH (FOR BLOOD PRESSURE SUPPORT)
PREFILLED_SYRINGE | INTRAVENOUS | Status: AC
  Filled 2013-04-26: qty 10

## 2013-04-26 MED ORDER — SENNOSIDES-DOCUSATE SODIUM 8.6-50 MG PO TABS
2.0000 | ORAL_TABLET | Freq: Every day | ORAL | Status: DC
  Administered 2013-04-26 – 2013-04-30 (×3): 2 via ORAL

## 2013-04-26 MED ORDER — OXYTOCIN 10 UNIT/ML IJ SOLN
40.0000 [IU] | INTRAVENOUS | Status: DC | PRN
  Administered 2013-04-26: 40 [IU] via INTRAVENOUS

## 2013-04-26 MED ORDER — MEASLES, MUMPS & RUBELLA VAC ~~LOC~~ INJ
0.5000 mL | INJECTION | Freq: Once | SUBCUTANEOUS | Status: DC
  Filled 2013-04-26: qty 0.5

## 2013-04-26 MED ORDER — 0.9 % SODIUM CHLORIDE (POUR BTL) OPTIME
TOPICAL | Status: DC | PRN
  Administered 2013-04-26: 1000 mL

## 2013-04-26 MED ORDER — ONDANSETRON HCL 4 MG/2ML IJ SOLN: 4.0000 mg | Freq: Three times a day (TID) | INTRAMUSCULAR | Status: DC | PRN

## 2013-04-26 MED ORDER — SIMETHICONE 80 MG PO CHEW: 80.0000 mg | CHEWABLE_TABLET | ORAL | Status: DC | PRN

## 2013-04-26 MED ORDER — FENTANYL CITRATE 0.05 MG/ML IJ SOLN
INTRAMUSCULAR | Status: DC | PRN
  Administered 2013-04-26: 15 ug via INTRATHECAL
  Administered 2013-04-26: 35 ug via INTRAVENOUS

## 2013-04-26 MED ORDER — NALOXONE HCL 1 MG/ML IJ SOLN: 1.0000 ug/kg/h | INTRAMUSCULAR | Status: DC | PRN

## 2013-04-26 SURGICAL SUPPLY — 31 items
CLAMP CORD UMBIL (MISCELLANEOUS) IMPLANT
CLOTH BEACON ORANGE TIMEOUT ST (SAFETY) ×2 IMPLANT
DERMABOND ADVANCED (GAUZE/BANDAGES/DRESSINGS) ×1
DERMABOND ADVANCED .7 DNX12 (GAUZE/BANDAGES/DRESSINGS) ×1 IMPLANT
DRAPE LG THREE QUARTER DISP (DRAPES) ×2 IMPLANT
DRSG OPSITE POSTOP 4X10 (GAUZE/BANDAGES/DRESSINGS) ×2 IMPLANT
DURAPREP 26ML APPLICATOR (WOUND CARE) ×2 IMPLANT
ELECT REM PT RETURN 9FT ADLT (ELECTROSURGICAL) ×2
ELECTRODE REM PT RTRN 9FT ADLT (ELECTROSURGICAL) ×1 IMPLANT
EXTRACTOR VACUUM M CUP 4 TUBE (SUCTIONS) IMPLANT
GLOVE BIO SURGEON STRL SZ 6.5 (GLOVE) ×2 IMPLANT
GLOVE BIOGEL PI IND STRL 7.0 (GLOVE) ×3 IMPLANT
GLOVE BIOGEL PI INDICATOR 7.0 (GLOVE) ×3
GOWN STRL REIN XL XLG (GOWN DISPOSABLE) ×6 IMPLANT
KIT ABG SYR 3ML LUER SLIP (SYRINGE) ×4 IMPLANT
NEEDLE HYPO 25X5/8 SAFETYGLIDE (NEEDLE) ×4 IMPLANT
NS IRRIG 1000ML POUR BTL (IV SOLUTION) ×2 IMPLANT
PACK C SECTION WH (CUSTOM PROCEDURE TRAY) ×2 IMPLANT
PAD OB MATERNITY 4.3X12.25 (PERSONAL CARE ITEMS) ×2 IMPLANT
STAPLER VISISTAT 35W (STAPLE) IMPLANT
SUT CHROMIC 0 CT 802H (SUTURE) IMPLANT
SUT CHROMIC 0 CTX 36 (SUTURE) ×4 IMPLANT
SUT MON AB-0 CT1 36 (SUTURE) ×2 IMPLANT
SUT PDS AB 0 CTX 60 (SUTURE) ×2 IMPLANT
SUT PLAIN 0 NONE (SUTURE) IMPLANT
SUT VIC AB 4-0 KS 27 (SUTURE) ×2 IMPLANT
SYR BULB 3OZ (MISCELLANEOUS) ×2 IMPLANT
SYRINGE 10CC LL (SYRINGE) ×2 IMPLANT
TOWEL OR 17X24 6PK STRL BLUE (TOWEL DISPOSABLE) ×6 IMPLANT
TRAY FOLEY CATH 14FR (SET/KITS/TRAYS/PACK) ×2 IMPLANT
WATER STERILE IRR 1000ML POUR (IV SOLUTION) ×2 IMPLANT

## 2013-04-26 NOTE — Transfer of Care (Signed)
Immediate Anesthesia Transfer of Care Note  Patient: Terry Paul  Procedure(s) Performed: Procedure(s): Primary cesarean section  with delivery of twin  A girl at 81. Twin B girl at 45. (N/A)  Patient Location: PACU  Anesthesia Type:Spinal  Level of Consciousness: awake, alert  and oriented  Airway & Oxygen Therapy: Patient Spontanous Breathing  Post-op Assessment: Report given to PACU RN and Post -op Vital signs reviewed and stable  Post vital signs: stable  Complications: No apparent anesthesia complications

## 2013-04-26 NOTE — Op Note (Signed)
Cesarean Section Procedure Note   Terry Paul  04/24/2013 - 04/26/2013  Indications: failed IOL, twins, IUGR of baby B   Pre-operative Diagnosis: failed induction.   Post-operative Diagnosis: Same   Surgeon: Surgeon(s) and Role:    * Zelphia Cairo, MD - Primary   Assistants: none  Anesthesia: spinal   Procedure Details:  The patient was seen in the Holding Room. The risks, benefits, complications, treatment options, and expected outcomes were discussed with the patient. The patient concurred with the proposed plan, giving informed consent. identified as Terry Paul and the procedure verified as C-Section Delivery. A Time Out was held and the above information confirmed.  After induction of anesthesia, the patient was draped and prepped in the usual sterile manner. A transverse was made and carried down through the subcutaneous tissue to the fascia. Fascial incision was made and extended transversely. The fascia was separated from the underlying rectus tissue superiorly and inferiorly. The peritoneum was identified and entered. Peritoneal incision was extended longitudinally. The utero-vesical peritoneal reflection was incised transversely and the bladder flap was bluntly freed from the lower uterine segment. A low transverse uterine incision was made. Delivered from cephalic presentation was Baby A - a vigerous female with Apgar scores of 8 at one minute and 9 at five minutes. Cord ph was not sent the umbilical cord was clamped and cut.  Baby B -  was then ruptured for clear fluid and delivered from the cephalic presentation.  Female infant was vigerous with apgars pending NICU team.  The cord blood was obtained for evaluation. The placenta was removed Intact and appeared normal. The uterine outline, tubes and ovaries appeared normal}. The uterine incision was closed with running locked sutures of 0chromic gut.   Hemostasis was observed. Lavage was carried out until clear.  Petitoneum was closed  with 0 monocryl.  The fascia was then reapproximated with running sutures of 0PDS. The skin was closed with 4-0Vicryl.   Instrument, sponge, and needle counts were correct prior the abdominal closure and were correct at the conclusion of the case.     Estimated Blood Loss: 800cc   Urine Output: clear  Specimens: @ORSPECIMEN @   Complications: no complications  Disposition: PACU - hemodynamically stable.   Maternal Condition: stable   Baby condition / location:  In OR with mom & dad  Attending Attestation: I was present and scrubbed for the entire procedure.   Signed: Surgeon(s): Zelphia Cairo, MD

## 2013-04-26 NOTE — OR Nursing (Addendum)
Uterus massaged by S. Raydin Bielinski Charity fundraiser. Two tubes of cord blood sent to lab.  15cc of blood evacuated from uterus during uterine massage.

## 2013-04-26 NOTE — Anesthesia Postprocedure Evaluation (Signed)
Anesthesia Post Note  Patient: Terry Paul  Procedure(s) Performed: Procedure(s) (LRB): Primary cesarean section  with delivery of twin  A girl at 37. Twin B girl at 14. (N/A)  Anesthesia type: Spinal  Patient location: PACU  Post pain: Pain level controlled  Post assessment: Post-op Vital signs reviewed  Last Vitals: BP 141/80  Pulse 69  Temp(Src) 36.7 C (Oral)  Resp 16  Ht 5' (1.524 m)  Wt 139 lb (63.05 kg)  BMI 27.15 kg/m2  SpO2 100%  LMP 08/11/2012  Post vital signs: Reviewed  Level of consciousness: sedated  Complications: No apparent anesthesia complications

## 2013-04-26 NOTE — Progress Notes (Addendum)
Cervix still closed.  Not uncomfortable with pitocin Attempts to increase pitocin causes decreased fht variability and occ early decels.  Offered continued pitocin induction with close FHT monitoring vs primary c-section.  Pt strongly desires c-section at this point. R/b/a discussed.  Informed consent obtained

## 2013-04-26 NOTE — Anesthesia Procedure Notes (Addendum)
Spinal  Patient location during procedure: OR Start time: 04/26/2013 9:38 AM End time: 04/26/2013 9:42 AM Staffing Anesthesiologist: Lewie Loron R Performed by: anesthesiologist  Preanesthetic Checklist Completed: patient identified, site marked, surgical consent, pre-op evaluation, timeout performed, IV checked, risks and benefits discussed and monitors and equipment checked Spinal Block Patient position: sitting Prep: DuraPrep Patient monitoring: heart rate, continuous pulse ox and blood pressure Approach: midline Location: L3-4 Injection technique: single-shot Needle Needle type: Sprotte  Needle gauge: 24 G Needle length: 9 cm Assessment Sensory level: T6 Additional Notes Expiration date of kit checked and confirmed. Patient tolerated procedure well, without complications.

## 2013-04-26 NOTE — Anesthesia Preprocedure Evaluation (Addendum)
Anesthesia Evaluation  Patient identified by MRN, date of birth, ID band Patient awake    Reviewed: Allergy & Precautions, H&P , NPO status , Patient's Chart, lab work & pertinent test results  Airway Mallampati: II TM Distance: >3 FB Neck ROM: Full    Dental  (+) Dental Advisory Given   Pulmonary neg pulmonary ROS,  breath sounds clear to auscultation        Cardiovascular negative cardio ROS  Rhythm:Regular Rate:Normal     Neuro/Psych negative neurological ROS  negative psych ROS   GI/Hepatic negative GI ROS, Neg liver ROS,   Endo/Other  negative endocrine ROS  Renal/GU negative Renal ROS     Musculoskeletal negative musculoskeletal ROS (+)   Abdominal   Peds  Hematology negative hematology ROS (+)   Anesthesia Other Findings   Reproductive/Obstetrics (+) Pregnancy                           Anesthesia Physical Anesthesia Plan  ASA: II and emergent  Anesthesia Plan: Spinal   Post-op Pain Management:    Induction:   Airway Management Planned:   Additional Equipment:   Intra-op Plan:   Post-operative Plan:   Informed Consent: I have reviewed the patients History and Physical, chart, labs and discussed the procedure including the risks, benefits and alternatives for the proposed anesthesia with the patient or authorized representative who has indicated his/her understanding and acceptance.     Plan Discussed with: CRNA  Anesthesia Plan Comments:         Anesthesia Quick Evaluation

## 2013-04-26 NOTE — Progress Notes (Signed)
Pt resting comfortably in PACU BP noted 150-160/80-90s at rest Fundus firm and VB minimal  CBC & CMET done - plts low, LFTs ok  Plan for PP mag sulfate Watch BP & UOP closely

## 2013-04-27 LAB — COMPREHENSIVE METABOLIC PANEL
BUN: 7 mg/dL (ref 6–23)
CO2: 22 mEq/L (ref 19–32)
Calcium: 8.1 mg/dL — ABNORMAL LOW (ref 8.4–10.5)
Chloride: 103 mEq/L (ref 96–112)
Creatinine, Ser: 0.94 mg/dL (ref 0.50–1.10)
GFR calc Af Amer: >90 mL/min (ref >90)
GFR calc non Af Amer: 84 mL/min — ABNORMAL LOW (ref >90)
Total Bilirubin: 0.2 mg/dL — ABNORMAL LOW (ref 0.3–1.2)

## 2013-04-27 LAB — CBC
HCT: 28.4 % — ABNORMAL LOW (ref 36.0–46.0)
MCH: 31.6 pg (ref 26.0–34.0)
MCV: 89.9 fL (ref 78.0–100.0)
RBC: 3.16 MIL/uL — ABNORMAL LOW (ref 3.87–5.11)
WBC: 12 10*3/uL — ABNORMAL HIGH (ref 4.0–10.5)

## 2013-04-27 MED ORDER — LABETALOL HCL 200 MG PO TABS
200.0000 mg | ORAL_TABLET | Freq: Once | ORAL | Status: DC
  Filled 2013-04-27: qty 1

## 2013-04-27 NOTE — Progress Notes (Signed)
Pt sleeping quietly

## 2013-04-27 NOTE — Lactation Note (Signed)
This note was copied from the chart of Zoe Goonan. Lactation Consultation Note  Patient Name: Terry Paul ZOXWR'U Date: 04/27/2013 Reason for consult: Follow-up assessment- per mom this twin has been sluggish compared to baby B  Placed baby A skin to skin , attempted on and off pattern left breast for 8 mins , also attempted #16 , #20 NS  Without success. , Switched to the right breast in a football position with better success with #20 NS , and fed for 15 mins, in A consistent pattern with dew swallows. Baby released, latched without NS and fed 3-4 minutes, released and feel asleep . Lactation plan of care - Encouraged rest , plenty fluids , esp H20 , breast shells between feedings, Steps for latching - breast massage, hand express, prepump  To make the nipple areola more compress able for a deeper latch . Apply nipple shield #20 NS if needed. Good feeding = 10 mins or greater . Post pump for 10 -15 mins  After both babies have fed.    Maternal Data Has patient been taught Hand Expression?: Yes (steady flow of colostrum noted ) Does the patient have breastfeeding experience prior to this delivery?: No  Feeding Feeding Type: Breast Milk Length of feed: 15 min (with #20 nipple shield, attempted 1st without noted on/off )  LATCH Score/Interventions Latch: Grasps breast easily, tongue down, lips flanged, rhythmical sucking. Intervention(s): Adjust position;Assist with latch;Breast massage;Breast compression  Audible Swallowing: A few with stimulation  Type of Nipple: Everted at rest and after stimulation (swollen areolas )  Comfort (Breast/Nipple): Soft / non-tender     Hold (Positioning): Assistance needed to correctly position infant at breast and maintain latch. (worked on depth with #20 NS ) Intervention(s): Breastfeeding basics reviewed;Support Pillows;Position options;Skin to skin  LATCH Score: 8  Lactation Tools Discussed/Used Tools: Shells;Nipple  Shields;Pump;Other (comment) (syringe provided whenEBM available /insert ) Nipple shield size: 20 Shell Type: Inverted Breast pump type: Manual (prepump with hand pump, post pump DEBP )   Consult Status Consult Status: Follow-up (LC plan of care explained to parents , copy given ) Date: 04/28/13 Follow-up type: In-patient    Kathrin Greathouse 04/27/2013, 8:05 PM

## 2013-04-27 NOTE — Progress Notes (Signed)
Working on breastfeeding-BP postponed

## 2013-04-27 NOTE — Progress Notes (Signed)
Subjective: Postpartum Day 1: Cesarean Delivery Patient reports tolerating PO. Pt feeling sedated on magnesium.  No CP/SOB   Objective: Vital signs in last 24 hours: Temp:  [97.3 F (36.3 C)-98.9 F (37.2 C)] 98.6 F (37 C) (08/10 0454) Pulse Rate:  [53-89] 81 (08/10 0815) Resp:  [13-21] 20 (08/10 0815) BP: (113-164)/(67-95) 122/78 mmHg (08/10 0600) SpO2:  [96 %-100 %] 97 % (08/10 0815) Weight:  [60.827 kg (134 lb 1.6 oz)] 60.827 kg (134 lb 1.6 oz) (08/09 1600)  Physical Exam:  General: cooperative and no distress Lochia: appropriate Uterine Fundus: firm Incision: healing well, no significant drainage DVT Evaluation: No evidence of DVT seen on physical exam.  Labs reviewed.  LFTs normal, Plts stable   Recent Labs  04/26/13 1300 04/27/13 0510  HGB 11.9* 10.0*  HCT 33.2* 28.4*    Assessment/Plan: Status post Cesarean section. Doing well postoperatively.  Discontinue magnesium and transfer to floor. Watch BP closely  Terry Paul 04/27/2013, 9:16 AM

## 2013-04-27 NOTE — Anesthesia Postprocedure Evaluation (Signed)
  Anesthesia Post-op Note  Patient: Terry Paul  Procedure(s) Performed: Procedure(s): Primary cesarean section  with delivery of twin  A girl at 91. Twin B girl at 54. (N/A)  Patient Location: ICU  Anesthesia Type:Spinal  Level of Consciousness: awake, alert  and oriented  Airway and Oxygen Therapy: Patient Spontanous Breathing  Post-op Pain: mild  Post-op Assessment: Post-op Vital signs reviewed, Patient's Cardiovascular Status Stable, No headache, No backache, No residual numbness and No residual motor weakness  Post-op Vital Signs: Reviewed and stable  Complications: No apparent anesthesia complications

## 2013-04-28 ENCOUNTER — Encounter (HOSPITAL_COMMUNITY): Payer: Self-pay | Admitting: Obstetrics and Gynecology

## 2013-04-28 LAB — CBC
Hemoglobin: 9.7 g/dL — ABNORMAL LOW (ref 12.0–15.0)
Platelets: 137 10*3/uL — ABNORMAL LOW (ref 150–400)
RBC: 3.06 MIL/uL — ABNORMAL LOW (ref 3.87–5.11)
WBC: 11.7 10*3/uL — ABNORMAL HIGH (ref 4.0–10.5)

## 2013-04-28 MED ORDER — HYDROCODONE-ACETAMINOPHEN 5-325 MG PO TABS: 1.0000 | ORAL_TABLET | ORAL | Status: DC | PRN

## 2013-04-28 MED ORDER — LABETALOL HCL 200 MG PO TABS: 200.0000 mg | ORAL_TABLET | Freq: Once | ORAL | Status: DC

## 2013-04-28 MED ORDER — IBUPROFEN 600 MG PO TABS: 600.0000 mg | ORAL_TABLET | Freq: Four times a day (QID) | ORAL | Status: DC

## 2013-04-28 NOTE — Lactation Note (Signed)
This note was copied from the chart of GirlB Yudit Modesitt. Lactation Consultation Note  Patient Name: Terry Paul ZOXWR'U Date: 04/28/2013 Reason for consult: Follow-up assessment Per mom baby B has been feeding without the nipple shield .  Baby recently fed , at 1350  For 25 mins. LC unable to assess.    Maternal Data    Feeding Feeding Type:  (recently fed for 25 mins at 1350 per mom ) Length of feed: 25 min (per mom consistent pattern )  LATCH Score/Interventions                Intervention(s): Breastfeeding basics reviewed     Lactation Tools Discussed/Used     Consult Status Consult Status: Follow-up Date: 04/29/13 Follow-up type: In-patient    Kathrin Greathouse 04/28/2013, 2:49 PM

## 2013-04-28 NOTE — Progress Notes (Signed)
Subjective: Postpartum Day 2: Cesarean Delivery Patient reports incisional pain, tolerating PO, + flatus and no problems voiding.  Desires discharge. Denies HA , RUQ pain,  Objective: Vital signs in last 24 hours: Temp:  [98 F (36.7 C)-99.3 F (37.4 C)] 98.2 F (36.8 C) (08/11 0532) Pulse Rate:  [67-90] 76 (08/11 0532) Resp:  [16-20] 18 (08/11 0532) BP: (127-154)/(70-93) 142/90 mmHg (08/11 0532) SpO2:  [96 %-100 %] 100 % (08/10 1548)  Physical Exam:  General: alert and cooperative Lochia: appropriate Uterine Fundus: firm Incision: honeycomb dressing CDI DVT Evaluation: No evidence of DVT seen on physical exam. Negative Homan's sign. No cords or calf tenderness. No significant calf/ankle edema.   Recent Labs  04/27/13 0510 04/28/13 0605  HGB 10.0* 9.7*  HCT 28.4* 27.3*    Assessment/Plan: Status post Cesarean section. Doing well postoperatively.  Discharge home with standard precautions and return to clinic in 3-4 days for BP and incision check.  CURTIS,CAROL G 04/28/2013, 8:10 AM

## 2013-04-28 NOTE — Discharge Summary (Signed)
Obstetric Discharge Summary Reason for Admission: induction of labor Prenatal Procedures: NST and ultrasound Intrapartum Procedures: cesarean: low cervical, transverse Postpartum Procedures: none Complications-Operative and Postpartum: none Hemoglobin  Date Value Range Status  04/28/2013 9.7* 12.0 - 15.0 g/dL Final     HCT  Date Value Range Status  04/28/2013 27.3* 36.0 - 46.0 % Final    Physical Exam:  General: alert and cooperative Lochia: appropriate Uterine Fundus: firm Incision: honeycomb dressing CDI DVT Evaluation: No evidence of DVT seen on physical exam. Negative Homan's sign. No cords or calf tenderness. No significant calf/ankle edema. DTR's 2+ no clonus  Discharge Diagnoses: Term Pregnancy-delivered, twins  Discharge Information: Date: 04/28/2013 Activity: pelvic rest Diet: routine Medications: PNV, Ibuprofen, Vicodin and labetalol Condition: stable Instructions: refer to practice specific booklet Discharge to: home   Newborn Data:   Jamella, Grayer [[id number]]  Live born female  Birth Weight: 4 lb 13.4 oz (2195 g) APGAR: 8, [address]   Ambriana, [place] [[id number]]  Live born female  Birth Weight: 5 lb 2.4 oz (2335 g) APGAR: 8, 9  Home with mother.  CURTIS,CAROL G 04/28/2013, 8:29 AM

## 2013-04-28 NOTE — Lactation Note (Signed)
This note was copied from the chart of [name]. Lactation Consultation Note  Patient Name: [name] ZOXWR'U Date: 04/28/2013 Reason for consult: Follow-up assessment Per mom baby doesn't always have to latch with the nipple shield. @ consult baby latched without the nipple shield with depth obtained  Noted a consistent pattern with multiply swallows, increased with breast compressions. Consistent pattern for 14 mins and re-latched with depth. Encouraged mom to post pump to enhance the mature  milk coming in .    Maternal Data    Feeding Feeding Type: Breast Milk (left breast , cross cradle ) Length of feed: 15 min  LATCH Score/Interventions Latch: Grasps breast easily, tongue down, lips flanged, rhythmical sucking. Intervention(s): Adjust position;Assist with latch;Breast massage;Breast compression  Audible Swallowing: Spontaneous and intermittent  Type of Nipple: Everted at rest and after stimulation  Comfort (Breast/Nipple): Soft / non-tender     Hold (Positioning): Assistance needed to correctly position infant at breast and maintain latch. Intervention(s): Breastfeeding basics reviewed;Support Pillows;Position options;Skin to skin  LATCH Score: 9  Lactation Tools Discussed/Used Tools: Shells Shell Type: Inverted Breast pump type: Double-Electric Breast Pump   Consult Status Consult Status: Follow-up Date: 04/29/13 Follow-up type: In-patient    Kathrin Greathouse 04/28/2013, 2:38 PM

## 2013-04-29 ENCOUNTER — Encounter (HOSPITAL_COMMUNITY): Payer: Self-pay

## 2013-04-29 LAB — CBC
HCT: 30.2 % — ABNORMAL LOW (ref 36.0–46.0)
Hemoglobin: 10.6 g/dL — ABNORMAL LOW (ref 12.0–15.0)
MCHC: 35.1 g/dL (ref 30.0–36.0)

## 2013-04-29 LAB — COMPREHENSIVE METABOLIC PANEL
Alkaline Phosphatase: 147 U/L — ABNORMAL HIGH (ref 39–117)
BUN: 10 mg/dL (ref 6–23)
Chloride: 105 mEq/L (ref 96–112)
GFR calc Af Amer: >90 mL/min (ref >90)
Glucose, Bld: 93 mg/dL (ref 70–99)
Potassium: 4.1 mEq/L (ref 3.5–5.1)
Total Bilirubin: 0.3 mg/dL (ref 0.3–1.2)
Total Protein: 5.5 g/dL — ABNORMAL LOW (ref 6.0–8.3)

## 2013-04-29 LAB — LACTATE DEHYDROGENASE: LDH: 328 U/L — ABNORMAL HIGH (ref 94–250)

## 2013-04-29 MED ORDER — MAGNESIUM SULFATE 40 G IN LACTATED RINGERS - SIMPLE
2.0000 g/h | INTRAVENOUS | Status: DC
  Filled 2013-04-29: qty 500

## 2013-04-29 MED ORDER — MAGNESIUM SULFATE BOLUS VIA INFUSION
4.0000 g | Freq: Once | INTRAVENOUS | Status: AC
  Administered 2013-04-29: 4 g via INTRAVENOUS
  Filled 2013-04-29: qty 500

## 2013-04-29 MED ORDER — ACETAMINOPHEN 325 MG PO TABS
650.0000 mg | ORAL_TABLET | Freq: Four times a day (QID) | ORAL | Status: DC | PRN
  Administered 2013-04-29 – 2013-04-30 (×2): 650 mg via ORAL
  Filled 2013-04-29 (×2): qty 2

## 2013-04-29 MED ORDER — MAGNESIUM SULFATE 40 MG/ML IJ SOLN
4.0000 g | Freq: Once | INTRAMUSCULAR | Status: DC
  Filled 2013-04-29: qty 100

## 2013-04-29 MED ORDER — MAGNESIUM SULFATE 40 G IN LACTATED RINGERS - SIMPLE
2.0000 g/h | INTRAVENOUS | Status: DC
  Administered 2013-04-29: 2 g/h via INTRAVENOUS
  Filled 2013-04-29: qty 500

## 2013-04-29 MED ORDER — LACTATED RINGERS IV SOLN
INTRAVENOUS | Status: DC
  Administered 2013-04-29 – 2013-04-30 (×2): via INTRAVENOUS

## 2013-04-29 NOTE — Progress Notes (Signed)
CTSP: Has HA, feels "cloudy". No vision change or epigastric pain.  Has taken ibuprofen with no relief.  Filed Vitals:   04/29/13 1312  BP: 160/86  Pulse: 79  Temp:   Resp: 20   Patient smiling Lungs CTA Cor RRR Abd no epigastric tenderness, FFNT DTR 2+ without clonus  A: HA and labile BP  P: D/W patient and husband possible recurrent PP preeclampsia, fatigue, etc...      Will cancel discharge-observe      PIH labs now      Tylenol for HA       D/W patient possible magnesium or BP treatment if needed

## 2013-04-29 NOTE — Lactation Note (Signed)
This note was copied from the chart of Terry Paul. Lactation Consultation Note  Patient Name: Terry Paul Today's Date: 04/29/2013  Mom was asleep and it was time for baby to BF. Attempted to wake Mom but she did not wake so demonstrated to MGM how to finger feed baby with curved tipped syringe. Baby took formula easily using this method. Will have Mom pump when she wakens if baby not ready to BF. Advised to have Mom call with feeding for LC to assist parents with developing a feeding plan.    Maternal Data    Feeding Feeding Type: Formula  LATCH Score/Interventions                      Lactation Tools Discussed/Used     Consult Status      Kiyo Heal Ann 04/29/2013, 12:39 PM    

## 2013-04-29 NOTE — Lactation Note (Signed)
This note was copied from the chart of Leiana Rund. Lactation Consultation Note  Patient Name: Terry Paul ZOXWR'U Date: 04/29/2013 Reason for consult: Follow-up assessment;Infant weight loss;Infant < 6lbs;Late preterm infant;Multiple gestation MGM finger fed baby approx 17 ml of formula using curved tipped syringe. Mom was asleep at this visit and it was time for baby to eat. Mom to call with next feeding for LC to observe babies at the breast and help parents develop a feeding plan. Mom to pump when she awakens if babies not interested in BF.   Maternal Data    Feeding Feeding Type: Formula  LATCH Score/Interventions                      Lactation Tools Discussed/Used Tools: Pump;Other (comment) (curved tipped syringe) Breast pump type: Double-Electric Breast Pump   Consult Status Consult Status: Follow-up Date: 04/29/13 Follow-up type: In-patient    Alfred Levins 04/29/2013, 12:43 PM

## 2013-04-29 NOTE — Progress Notes (Signed)
Subjective: Postpartum Day 3: Cesarean Delivery Patient reports tolerating PO, + flatus and no problems voiding.  Discharge from yesterday cancelled secondary to infants loss of weight  Objective: Vital signs in last 24 hours: Temp:  [97.9 F (36.6 C)-98 F (36.7 C)] 97.9 F (36.6 C) (08/12 0545) Pulse Rate:  [63-74] 74 (08/12 0545) Resp:  [18] 18 (08/12 0545) BP: (119-158)/(79-88) 119/80 mmHg (08/12 0545)  Physical Exam:  General: alert and cooperative Lochia: appropriate Uterine Fundus: firm Incision: honeycomb dressing with small old drainage noted on bandage DVT Evaluation: No evidence of DVT seen on physical exam. Negative Homan's sign. No cords or calf tenderness. No significant calf/ankle edema.   Recent Labs  04/27/13 0510 04/28/13 0605  HGB 10.0* 9.7*  HCT 28.4* 27.3*    Assessment/Plan: Status post Cesarean section. Doing well postoperatively.  Discharge home with standard precautions and return to clinic in 1 week for incision check.  Darilyn Storbeck G 04/29/2013, 8:23 AM

## 2013-04-29 NOTE — Progress Notes (Signed)
HA not relieved with tylenol  BPs remained 160s/80s Labs OK  Because of persistent HA, patient transferred to [facility] for PP magnesium sulfate for seizure prophylaxis  Now BPs 140s/85-90 Magnesium infusion started  Lungs CTA DTR 2+ without clonus  A/P: Magnesium sulfate         Labs in am

## 2013-04-30 LAB — COMPREHENSIVE METABOLIC PANEL
ALT: 13 U/L (ref 0–35)
Albumin: 2 g/dL — ABNORMAL LOW (ref 3.5–5.2)
Alkaline Phosphatase: 145 U/L — ABNORMAL HIGH (ref 39–117)
Potassium: 3.8 mEq/L (ref 3.5–5.1)
Sodium: 136 mEq/L (ref 135–145)
Total Protein: 5 g/dL — ABNORMAL LOW (ref 6.0–8.3)

## 2013-04-30 LAB — CBC
MCHC: 35.1 g/dL (ref 30.0–36.0)
RDW: 13.3 % (ref 11.5–15.5)

## 2013-04-30 LAB — URIC ACID: Uric Acid, Serum: 6.9 mg/dL (ref 2.4–7.0)

## 2013-04-30 MED ORDER — LABETALOL HCL 100 MG PO TABS
100.0000 mg | ORAL_TABLET | Freq: Two times a day (BID) | ORAL | Status: DC
  Administered 2013-04-30 – 2013-05-01 (×3): 100 mg via ORAL
  Filled 2013-04-30 (×8): qty 1

## 2013-04-30 NOTE — Progress Notes (Signed)
  Patient feels better BP still fluctuates - several diastolics in the 90s No hyperreflexia on exam Labs platelets and AST/ALT are normal  IMPRESSION: Postpartum preeclampsia  PLAN Patient stable Discontinue the Magnesium  Start Labetalol 100 mg po bid Probable Discharge tomorrow on Labetalol

## 2013-04-30 NOTE — Lactation Note (Signed)
This note was copied from the chart of [name]. Lactation Consultation Note    Follow up consult with Baby B, who was very fussy for about 5-7 minutes when trying to get her latched. She refused nipple shield, but eventually dad was able to get baby latched without shield. She got sleepy, and once baby A finished, mom fed baby B by herself, in cross cradle hold. I showed mom how to position her hand behind babies back, to protect their  airways.  Baby breast fed for at least a total of 25 minutes.  Mom will post pump, and supplement the baby with EBM.  Patient Name: Terry Paul OZHYQ'M Date: 04/30/2013 Reason for consult: Follow-up assessment;Infant < 6lbs   Maternal Data    Feeding Feeding Type: Breast Milk Length of feed: 25 min  LATCH Score/Interventions Latch: Repeated attempts needed to sustain latch, nipple held in mouth throughout feeding, stimulation needed to elicit sucking reflex. Intervention(s): Adjust position;Assist with latch;Breast massage;Breast compression (dad assited mom with latching baby.)  Audible Swallowing: A few with stimulation  Type of Nipple: Everted at rest and after stimulation  Comfort (Breast/Nipple): Filling, red/small blisters or bruises, mild/mod discomfort  Problem noted: Filling Interventions (Filling): Double electric pump (mom encouraged to post pump and supplement with EBm)  Hold (Positioning): Assistance needed to correctly position infant at breast and maintain latch. Intervention(s): Breastfeeding basics reviewed;Skin to skin;Position options (shown how to support  behind their backs, and not heads, )  LATCH Score: 6  Lactation Tools Discussed/Used Tools: Nipple Shields Nipple shield size: 20 (baby b did not want nipple shiled this feeding)   Consult Status Consult Status: Follow-up Date: 05/01/13 Follow-up type: In-patient    Alfred Levins 04/30/2013, 2:51 PM

## 2013-04-30 NOTE — Progress Notes (Signed)
UR chart review completed.  

## 2013-04-30 NOTE — Lactation Note (Signed)
This note was copied from the chart of Terry Damia Krider. Lactation Consultation Note    Follow up consult with this mom and her twin baby A, now 4 days old (96 hours at 10 am), and 37 3/7 weeks corrected gestation, and weighing 4-6. Baby was sleepy at first at the breast, but suckled well for 20 minutes once 20 nipple shield applied. Dad very helpful with latching babies, so mom can breast feed both babies at the same time. Moms breasts are very full with knots of milk, and has easily expressed milk - she has not pumped since sometime yesterday, and MGM was saying that mom was told not to pump at night. I gently suggested MGM may have misunderstood, since the lactation note from yesterday said for mom to pump "once she wakes up", if the babies are not breast feeding.  I reviewed with mom and dad how mom will lose her milk supply if the babies are being fed formula, and mom does not pump. Also, since she was so full, the babies were having a difficult time latching. Mom agreed to post pump, and to supplement her babies with EBM, when available, as opposed to formula. I told mom and dad they could feed the babies as they have been doing , which is mostly with curved tip syringe/finger feeding, and some bottle.  I also introduced the SNS system to mom and dad, and told them how it works, and that the babies could be supplemented with I also offered to get a scale, and do a pre and post weight on the babies with their next feeding, to see what they are capable of transferring. This will give us a better idea of how much they need to be supplemented, and how often.  I will wait for mom's nurse  to call me for next feeding.  Patient Name: Terry Paul Today's Date: 04/30/2013 Reason for consult: Follow-up assessment;Infant < 6lbs   Maternal Data    Feeding Feeding Type: Breast Milk Length of feed: 20 min  LATCH Score/Interventions Latch: Repeated attempts needed to sustain latch, nipple held in mouth  throughout feeding, stimulation needed to elicit sucking reflex. Intervention(s): Adjust position;Assist with latch;Breast massage;Breast compression (dad helped to get baby latched)  Audible Swallowing: A few with stimulation Intervention(s): Skin to skin  Type of Nipple: Everted at rest and after stimulation  Comfort (Breast/Nipple): Filling, red/small blisters or bruises, mild/mod discomfort  Problem noted: Filling  Hold (Positioning): Assistance needed to correctly position infant at breast and maintain latch. Intervention(s): Breastfeeding basics reviewed;Skin to skin;Position options  LATCH Score: 6  Lactation Tools Discussed/Used Tools: Nipple Shields Nipple shield size: 20   Consult Status Consult Status: Follow-up Date: 05/01/13 Follow-up type: In-patient    Yatzari Jonsson Anne 04/30/2013, 2:35 PM    

## 2013-05-01 ENCOUNTER — Encounter (HOSPITAL_COMMUNITY)
Admission: RE | Admit: 2013-05-01 | Discharge: 2013-05-01 | Disposition: A | Discharge status: Home / Self Care | Payer: Managed Care, Other (non HMO) | Source: Ambulatory Visit | Attending: Obstetrics and Gynecology | Admitting: Obstetrics and Gynecology

## 2013-05-01 ENCOUNTER — Encounter (HOSPITAL_COMMUNITY): Payer: Self-pay | Admitting: Emergency Medicine

## 2013-05-01 DIAGNOSIS — Z98891 History of uterine scar from previous surgery: Secondary | ICD-10-CM

## 2013-05-01 DIAGNOSIS — O30009 Twin pregnancy, unspecified number of placenta and unspecified number of amniotic sacs, unspecified trimester: Secondary | ICD-10-CM | POA: Diagnosis present

## 2013-05-01 DIAGNOSIS — O365930 Maternal care for other known or suspected poor fetal growth, third trimester, not applicable or unspecified: Secondary | ICD-10-CM | POA: Diagnosis present

## 2013-05-01 DIAGNOSIS — O165 Unspecified maternal hypertension, complicating the puerperium: Secondary | ICD-10-CM | POA: Insufficient documentation

## 2013-05-01 MED ORDER — LABETALOL HCL 100 MG PO TABS: 100.0000 mg | ORAL_TABLET | Freq: Two times a day (BID) | ORAL | Status: DC

## 2013-05-01 MED ORDER — ACETAMINOPHEN 325 MG PO TABS: 650.0000 mg | ORAL_TABLET | Freq: Four times a day (QID) | ORAL | Status: DC | PRN

## 2013-05-01 MED ORDER — LABETALOL HCL 200 MG PO TABS: 100.0000 mg | ORAL_TABLET | Freq: Two times a day (BID) | ORAL | Status: DC

## 2013-05-01 NOTE — Discharge Summary (Signed)
[name], Terry Paul NO.:  [id number]  MEDICAL RECORD NO.:  [id number]  LOCATION:  9306                          FACILITY:  [facility]  PHYSICIAN:  Juluis Mire, M.D.   DATE OF BIRTH:  08-11-1991  DATE OF ADMISSION:  04/24/2013 DATE OF DISCHARGE:  05/01/2013                              DISCHARGE SUMMARY   ADMITTING DIAGNOSIS:  Monochorionic diamniotic twins complicated by discordant growth for induction of labor.  DISCHARGE DIAGNOSIS:  Monochorionic diamniotic twins complicated by discordant growth for induction of labor with subsequent failure to progress leading to a primary cesarean section.  PROCEDURE:  Low transverse cesarean section.  For complete history and physical, see dictated note.  COURSE IN THE HOSPITAL:  The patient brought in for induction after 2 days.  She declined any further attempts at induction and underwent a primary cesarean section.  She delivered twin babies, twin A was a female with Apgars of 8 and 9, twin B was a female and Apgars were still pending after delivery.  Postoperatively, we did see an elevation of blood pressure.  She did have a thrombocytopenia, liver function tests were normal.  She was placed on magnesium sulfate after delivery due to the platelets and elevated blood pressures.  This was subsequently discontinued. Subsequently on her 3rd postoperative day, her blood pressure went up. Her labs stated normal but they canceled her discharge and began her on antihypertensive.  She was also placed on magnesium sulfate.  This was subsequently discontinued.  Throughout the 13, she did well on labetalol and is going to be discharged home at this time.  At the time of discharge, she is afebrile.  Vital signs are stable.  Abdomen soft and nontender.  Bowel sounds were active.  She is passing flatus.  Incision was clear.  Fundus firm and nontender.  She is voiding without difficulty.  She had normal lochia.  Postop hemoglobin  was 10.  In terms of complications, they are noted above.  The patient discharged home in stable condition.  DISPOSITION:  The patient avoid heavy lifting, driving a car, vaginal entrance.  She is to watch for signs of infection, bleeding, mastitis, endometritis, or incisional infections.  She is also instructed to watch for heavy bleeding or signs and symptoms of deep venous thrombosis and pulmonary embolus.  Discharged home on labetalol.  She will follow up early next week.    Juluis Mire, M.D.    JSM/MEDQ  D:  05/01/2013  T:  05/01/2013  Job:  [id number]

## 2013-05-01 NOTE — Lactation Note (Addendum)
This note was copied from the chart of [name]. Lactation Consultation Note      Follow up consult with this mom of twins, now 5 days old, and 374/[redacted] weeks gestation. Both babies have begun to gain weight, with breast feeding on cue, and supplement with EBM.  Babies were at 9 and 10 % weight loss for two days, and today, baby A is at 6% weight loss,and weighs 4 lbs 8.7 oz ,  and baby B is at 4 % weight loss. , and weighs      4 lbs 15.2 oz                 . I suggested since both babies are under 5 lbs, mom and dad and MGM begin supplementing EBM by bottle with slow flow nipples, as opposed to curved tip syringe. Mom is still encouraged to breast feed as often as she can and babies want to, but pumping and bottle feeding EBM and not breast feeding each time, is fine also. This can save some time for mom and dad to rest. Mom is post pumping ,and exressing about 30 mls of milk. I advised mom to try and get in 6 pumpings a day, to protect her milk supply, and to provide supplement with EBM. I showed mom and dad how to massage  knots of milk during pumping, and how to hand express after pumping, to increase supply. i also gave mom a NICU book on providing breast milk, even though the babies are not in NICU, and pointed out the charts in the book which would be helpful to them.  The babies have a pediatrician appointment , which dad is going to make, for Friday, 05/02/13. Mom is aware to call lactation for questions/concerns, and to try and come back for a follow up visit sometime next week. Both babies are using 20 nipple shields to breast feed.  I also advised mom to purchase a breast feeding bra that fits well,since her breast size is increasing. I informed her that this could be done here in our lactation store.   Patient Name: [name] Today's Date: 05/01/2013 Reason for consult: Follow-up assessment;Infant < 6lbs;Multiple gestation   Maternal Data    Feeding    LATCH  Score/Interventions                      Lactation Tools Discussed/Used     Consult Status Consult Status: Complete Follow-up type: Call as needed    Terry Paul 05/01/2013, 11:14 AM    

## 2013-05-01 NOTE — Discharge Summary (Signed)
  Patient name  Kadee, Philyaw DICTATION#  [id number] CSN# [id number]  Juluis Mire, MD 05/01/2013 10:18 AM

## 2013-05-01 NOTE — ED Notes (Signed)
PT. REPORTS ELEVATED BLOOD PRESSURE AT HOME THIS EVENING = 180/110 , PT. STATED C- SECTION TWIN DELIVERY LAST 04/26/2013 AT [facility] , DISCHARGED TODAY AT [facility] S/P [facility] DURING PREGNANCY. DID NOT TAKE HER LABETALOL THIS EVENING .

## 2013-05-01 NOTE — Progress Notes (Signed)
Subjective: Postpartum Day five: Cesarean Delivery Patient reports tolerating PO and no problems voiding.    Objective: Vital signs in last 24 hours: Temp:  [98.4 F (36.9 C)-99 F (37.2 C)] 98.9 F (37.2 C) (08/14 0603) Pulse Rate:  [71-94] 82 (08/14 0603) Resp:  [16-18] 18 (08/14 0603) BP: (125-147)/(80-90) 138/84 mmHg (08/14 0603) SpO2:  [97 %-100 %] 97 % (08/14 0603)  Physical Exam:  General: alert Lochia: appropriate Uterine Fundus: firm Incision: healing well DVT Evaluation: No evidence of DVT seen on physical exam.   Recent Labs  04/29/13 1410 04/30/13 0515  HGB 10.6* 10.0*  HCT 30.2* 28.5*    Assessment/Plan: Status post Cesarean section. Doing well postoperatively.  Discharge home with standard precautions and return to clinic in 4-6 weeks.  Terry Paul S 05/01/2013, 10:18 AM

## 2013-05-02 ENCOUNTER — Emergency Department (HOSPITAL_COMMUNITY)
Admission: EM | Admit: 2013-05-02 | Discharge: 2013-05-02 | Discharge status: Against Medical Advice | Payer: Managed Care, Other (non HMO) | Attending: Emergency Medicine | Admitting: Emergency Medicine

## 2013-05-02 HISTORY — DX: Unspecified pre-eclampsia, unspecified trimester: O14.90

## 2013-05-02 NOTE — ED Notes (Signed)
NURSE FIRST ROUNDS : UNABLE TO LOCATE PT. AT TRIAGE AND WAITING AREA SEVERAL TIMES.

## 2013-06-01 ENCOUNTER — Encounter (HOSPITAL_COMMUNITY)
Admission: RE | Admit: 2013-06-01 | Discharge: 2013-06-01 | Disposition: A | Discharge status: Home / Self Care | Payer: Managed Care, Other (non HMO) | Source: Ambulatory Visit | Attending: Obstetrics and Gynecology | Admitting: Obstetrics and Gynecology

## 2013-06-01 DIAGNOSIS — O923 Agalactia: Secondary | ICD-10-CM | POA: Insufficient documentation

## 2013-07-10 DIAGNOSIS — L989 Disorder of the skin and subcutaneous tissue, unspecified: Secondary | ICD-10-CM | POA: Insufficient documentation

## 2013-09-22 DIAGNOSIS — O165 Unspecified maternal hypertension, complicating the puerperium: Secondary | ICD-10-CM | POA: Insufficient documentation

## 2014-07-20 ENCOUNTER — Encounter (HOSPITAL_COMMUNITY): Payer: Self-pay | Admitting: Emergency Medicine

## 2018-05-03 ENCOUNTER — Ambulatory Visit (HOSPITAL_COMMUNITY)
Admission: EM | Admit: 2018-05-03 | Discharge: 2018-05-03 | Disposition: A | Discharge status: Home / Self Care | Payer: No Typology Code available for payment source | Attending: Family Medicine | Admitting: Family Medicine

## 2018-05-03 ENCOUNTER — Encounter (HOSPITAL_COMMUNITY): Payer: Self-pay

## 2018-05-03 DIAGNOSIS — Z791 Long term (current) use of non-steroidal anti-inflammatories (NSAID): Secondary | ICD-10-CM | POA: Diagnosis not present

## 2018-05-03 DIAGNOSIS — R42 Dizziness and giddiness: Secondary | ICD-10-CM | POA: Diagnosis not present

## 2018-05-03 DIAGNOSIS — R1031 Right lower quadrant pain: Secondary | ICD-10-CM | POA: Insufficient documentation

## 2018-05-03 DIAGNOSIS — Z79899 Other long term (current) drug therapy: Secondary | ICD-10-CM | POA: Insufficient documentation

## 2018-05-03 DIAGNOSIS — Z79891 Long term (current) use of opiate analgesic: Secondary | ICD-10-CM | POA: Insufficient documentation

## 2018-05-03 DIAGNOSIS — R1110 Vomiting, unspecified: Secondary | ICD-10-CM | POA: Diagnosis not present

## 2018-05-03 DIAGNOSIS — R109 Unspecified abdominal pain: Secondary | ICD-10-CM | POA: Diagnosis present

## 2018-05-03 DIAGNOSIS — R102 Pelvic and perineal pain: Secondary | ICD-10-CM | POA: Diagnosis not present

## 2018-05-03 DIAGNOSIS — Z9889 Other specified postprocedural states: Secondary | ICD-10-CM | POA: Insufficient documentation

## 2018-05-03 LAB — POCT URINALYSIS DIP (DEVICE)
BILIRUBIN URINE: NEGATIVE
GLUCOSE, UA: NEGATIVE mg/dL
HGB URINE DIPSTICK: NEGATIVE
Ketones, ur: NEGATIVE mg/dL
LEUKOCYTES UA: NEGATIVE
Nitrite: NEGATIVE
Protein, ur: NEGATIVE mg/dL
Specific Gravity, Urine: 1.015 (ref 1.005–1.030)
UROBILINOGEN UA: 0.2 mg/dL (ref 0.0–1.0)
pH: 8.5 — ABNORMAL HIGH (ref 5.0–8.0)

## 2018-05-03 LAB — POCT PREGNANCY, URINE: PREG TEST UR: NEGATIVE

## 2018-05-03 NOTE — ED Triage Notes (Signed)
Pt presents with sudden onset of right lower quadrant while urinating this morning. States when it initially happened she felt light headed and nauseous from the pain. All has subsided at this time.

## 2018-05-03 NOTE — Discharge Instructions (Addendum)
It was nice meeting you!!  Your urine was negative for infection or pregnancy. We will send for culture to rule out yeast or bacteria. If you develop any worsening abdominal pain like you did this a.m. please go to the ER for CT scan.

## 2018-05-03 NOTE — ED Provider Notes (Signed)
[facility]    CSN: [id number] Arrival date & time: 05/03/18  [id number]     History   Chief Complaint Chief Complaint  Patient presents with  . Abdominal Pain    HPI Terry Paul is a 27 y.o. female.   Patient is a healthy 27 year old female that presents with acute sudden onset of right lower quadrant/pelvic pain this a.m.  She reports that she woke up with severe pain.  This made her vomit and become dizzy.  The pain lasted about 20 minutes and then dissipated.  She is not having any pain now or nausea. She has been told in the past that she possibly had an ovarian cyst. She denies any vaginal bleeding or discharge. sts some slight vaginal itching but she gets this sometimes before her menstrual cycle. Denies any dysuria, hematuria, back pain. Reports she is sexually active with her husband. He has had a vasectomy.   ROS per HPI      Past Medical History:  Diagnosis Date  . Preeclampsia   . UTI (lower urinary tract infection)     Patient Active Problem List   Diagnosis Date Noted  . Status post primary low transverse cesarean section 05/01/2013    Class: Status post  . Twin pregnancy delivered 05/01/2013    Class: Status post  . Twin pregnancy, twins discordant in third trimester 05/01/2013    Class: Present on Admission    Past Surgical History:  Procedure Laterality Date  . CESAREAN SECTION N/A 04/26/2013   Procedure: Primary cesarean section  with delivery of twin  A girl at [id number]. Twin B girl at [id number].;  Surgeon: Zelphia CairoGretchen Adkins, MD;  Location: [facility];  Service: Obstetrics;  Laterality: N/A;    OB History    Gravida  1   Para  1   Term      Preterm  1   AB      Living  2     SAB      TAB      Ectopic      Multiple  1   Live Births  2            Home Medications    Prior to Admission medications   Medication Sig Start Date End Date Taking? Authorizing Provider  acetaminophen (TYLENOL) 325 MG tablet Take 2 tablets (650 mg total)  by mouth every 6 (six) hours as needed. 05/01/13   Julio Sicksurtis, Carol, NP  HYDROcodone-acetaminophen (NORCO/VICODIN) 5-325 MG per tablet Take 1 tablet by mouth every 4 (four) hours as needed. 04/28/13   Julio Sicksurtis, Carol, NP  ibuprofen (ADVIL,MOTRIN) 600 MG tablet Take 1 tablet (600 mg total) by mouth every 6 (six) hours. 04/28/13   Julio Sicksurtis, Carol, NP  labetalol (NORMODYNE) 200 MG tablet Take 0.5 tablets (100 mg total) by mouth 2 (two) times daily. 05/01/13   Julio Sicksurtis, Carol, NP  Prenatal Vit-Fe Fumarate-FA (PRENATAL MULTIVITAMIN) TABS Take 1 tablet by mouth daily at 12 noon.    [provider]    Family History Family History  Problem Relation Age of Onset  . Healthy Mother   . Healthy Father     Social History Social History   Tobacco Use  . Smoking status: Never Smoker  . Smokeless tobacco: Never Used  Substance Use Topics  . Alcohol use: Not on file  . Drug use: No     Allergies   Patient has no known allergies.   Review of Systems Review of Systems  Physical Exam Triage Vital Signs ED Triage Vitals  Enc Vitals Group     BP 05/03/18 0840 110/75     Pulse Rate 05/03/18 0840 81     Resp 05/03/18 0840 18     Temp 05/03/18 0840 98.2 F (36.8 C)     Temp src --      SpO2 05/03/18 0840 100 %     Weight --      Height --      Head Circumference --      Peak Flow --      Pain Score 05/03/18 0841 0     Pain Loc --      Pain Edu? --      Excl. in [place]? --    No data found.  Updated Vital Signs BP 110/75   Pulse 81   Temp 98.2 F (36.8 C)   Resp 18   LMP 04/09/2018   SpO2 100%   Visual Acuity Right Eye Distance:   Left Eye Distance:   Bilateral Distance:    Right Eye Near:   Left Eye Near:    Bilateral Near:     Physical Exam  Constitutional: She appears well-developed and well-nourished.  HENT:  Head: Normocephalic and atraumatic.  Eyes: Pupils are equal, round, and reactive to light.  Pulmonary/Chest: Effort normal.  Abdominal: Soft. Normal  appearance, normal aorta and bowel sounds are normal. There is tenderness.  Tenderness to right pelvic area.  Negative rebound.  Negative masses.   Neurological: She is alert.  Skin: Skin is warm and dry.  Psychiatric: She has a normal mood and affect.  Nursing note and vitals reviewed.    UC Treatments / Results  Labs (all labs ordered are listed, but only abnormal results are displayed) Labs Reviewed  POCT URINALYSIS DIP (DEVICE) - Abnormal; Notable for the following components:      Result Value   pH 8.5 (*)    All other components within normal limits  POCT PREGNANCY, URINE  URINE CYTOLOGY ANCILLARY ONLY    EKG None  Radiology No results found.  Procedures Procedures (including critical care time)  Medications Ordered in UC Medications - No data to display  Initial Impression / Assessment and Plan / UC Course  I have reviewed the triage vital signs and the nursing notes.  Pertinent labs & imaging results that were available during my care of the patient were reviewed by me and considered in my medical decision making (see chart for details).    Urine negative for infection. Pregnancy negative. Less likely tubal pregnancy or appendicitis.  Most likely ovarian cyst- she has had these in the past. She is not having any pain now. Some tenderness to right pelvic area. No rebound tenderness. Instructed that if the symptoms return to go to the ER for a CT scan. Pt understanding and agreeable to plan.  Final Clinical Impressions(s) / UC Diagnoses   Final diagnoses:  Right lower quadrant abdominal pain     Discharge Instructions     It was nice meeting you!!  Your urine was negative for infection or pregnancy. We will send for culture to rule out yeast or bacteria. If you develop any worsening abdominal pain like you did this a.m. please go to the ER for CT scan.    ED Prescriptions    None     Controlled Substance Prescriptions [place] Controlled Substance  Registry consulted? Not Applicable   Janace ArisBast, Whitt Auletta A, NP 05/03/18 1018

## 2018-05-07 LAB — URINE CYTOLOGY ANCILLARY ONLY
BACTERIAL VAGINITIS: NEGATIVE
CANDIDA VAGINITIS: NEGATIVE

## 2019-06-12 ENCOUNTER — Ambulatory Visit (INDEPENDENT_AMBULATORY_CARE_PROVIDER_SITE_OTHER): Payer: Self-pay | Admitting: Family Medicine

## 2019-06-12 ENCOUNTER — Encounter: Payer: Self-pay | Admitting: Family Medicine

## 2019-06-12 ENCOUNTER — Other Ambulatory Visit: Payer: Self-pay

## 2019-06-12 VITALS — BP 122/80 | HR 74 | Temp 97.6°F | Wt 88.4 lb

## 2019-06-12 DIAGNOSIS — R636 Underweight: Secondary | ICD-10-CM | POA: Insufficient documentation

## 2019-06-12 DIAGNOSIS — J3089 Other allergic rhinitis: Secondary | ICD-10-CM | POA: Insufficient documentation

## 2019-06-12 DIAGNOSIS — Z8349 Family history of other endocrine, nutritional and metabolic diseases: Secondary | ICD-10-CM

## 2019-06-12 DIAGNOSIS — J3489 Other specified disorders of nose and nasal sinuses: Secondary | ICD-10-CM

## 2019-06-12 DIAGNOSIS — N926 Irregular menstruation, unspecified: Secondary | ICD-10-CM

## 2019-06-12 NOTE — Patient Instructions (Signed)
It was a pleasure meeting you today.   You may want to try Xyzal daily for a week or two to see if this help with your nasal drainage.   Pay attention to your daily calorie intake to ensure you are getting adequate calories and protein. I recommend that you get at least 1,200 calories per day.   We will forward your results to you when available.

## 2019-06-12 NOTE — Progress Notes (Signed)
Subjective:    Patient ID: Terry Paul, female    DOB: August 09, 1991, 28 y.o.   MRN: [id number]  HPI Chief Complaint  Patient presents with  . new pt    new pt, get established, will get flu shot at work   She is new to the practice and here to establish care.  Previous medical care: Dr. Olevia Bowens in [facility]   Other providers: OB/GYN- Dr. Bethann Goo   Complains of nasal drainage and occasional post nasal drainage with a cough a couple of times per day. She thinks she may have allergies. This is chronic and not worsening.  Tried Claritin several months ago but only for 2 days.   States she had fever and cough 2 weeks ago and tested negative for Covid-19. States she feels back to baseline today.   States her family members have expressed concern due to her weight and being so thin.  States she feels good about her weight. States she is 5-8 lbs lighter than her pre baby weight.   States she gained 40 lbs with her twins and lost it all easily.   Reports eating 3 meals per day. Denies skipping meals or having a history of eating disorder. Feels like her relationship with food is fine.  States her husband is also thin.   Denies fever, chills, unexplained weight loss, fatigue, headache, chest pain, palpitations, shortness of breath, abdominal pain, N/V/D.  Reports having a bowel movement daily. No changes in bowel habits.  Denies cold intolerance.  Denies changes to skin, hair or nails.  Her mother has a history of thyroid disorder but she is not sure if hyper or hypo.    History of UTIs in the past but none recently.  States she gets yeast infections with her cycles.   Reports having twins in 2014 and another child in 2017.  Married.  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   History of Pre-eclampsia   Denies smoking, drinking alcohol, drug use  Diet: healthy  Excerise: not often   Last Menstrual cycle: 05/29/2019 Husband had a vasectomy    Reviewed allergies, medications, past medical,  surgical, family, and social history.    Review of Systems Pertinent positives and negatives in the history of present illness.     Objective:   Physical Exam Constitutional:      General: She is not in acute distress.    Appearance: She is underweight.  Eyes:     General: Lids are normal.     Conjunctiva/sclera: Conjunctivae normal.  Neck:     Musculoskeletal: Normal range of motion and neck supple.     Thyroid: No thyromegaly or thyroid tenderness.  Cardiovascular:     Rate and Rhythm: Normal rate and regular rhythm.     Pulses: Normal pulses.     Heart sounds: Normal heart sounds.  Pulmonary:     Effort: Pulmonary effort is normal.     Breath sounds: Normal breath sounds.  Musculoskeletal: Normal range of motion.     Right lower leg: No edema.     Left lower leg: No edema.  Lymphadenopathy:     Cervical: No cervical adenopathy.     Right cervical: No superficial cervical adenopathy.    Left cervical: No superficial cervical adenopathy.  Skin:    General: Skin is warm and dry.     Capillary Refill: Capillary refill takes less than 2 seconds.     Comments: Nails normal   Neurological:     Mental  Status: She is alert and oriented to person, place, and time.  Psychiatric:        Attention and Perception: Attention normal.        Mood and Affect: Mood and affect normal.        Speech: Speech normal.        Thought Content: Thought content normal.    BP 122/80   Pulse 74   Temp 97.6 F (36.4 C)   Wt 88 lb 6.4 oz (40.1 kg)   LMP 05/29/2019   BMI 17.26 kg/m       Assessment & Plan:  Irregular menses - Plan: CBC with Differential/Platelet, Comprehensive metabolic panel, TSH, T4, free She will see her OB/GYN soon for her annual exam. Discussed low BMI may play a role. Check labs and follow up.   Underweight - Plan: CBC with Differential/Platelet, Comprehensive metabolic panel, TSH, T4, free, Prealbumin -she denies history of eating disorder and denies any  concerns with food or her weight. Expresses her family members are concerned about her weight. Reports healthy eating habits. Check labs and follow up.   Family history of thyroid disease in mother - Plan: TSH, T4, free  Nasal drainage- may be related to allergies.   Environmental and seasonal allergies- she may want to try Xyzal or another antihistamine and see if this helps.   Discussed turning this visit into a physical but she declines due to not having preventive health coverage.

## 2019-06-13 LAB — COMPREHENSIVE METABOLIC PANEL
ALT: 11 IU/L (ref 0–32)
AST: 16 IU/L (ref 0–40)
Albumin/Globulin Ratio: 1.9 (ref 1.2–2.2)
Albumin: 4.8 g/dL (ref 3.9–5.0)
Alkaline Phosphatase: 68 IU/L (ref 39–117)
BUN/Creatinine Ratio: 14 (ref 9–23)
BUN: 9 mg/dL (ref 6–20)
Bilirubin Total: 0.3 mg/dL (ref 0.0–1.2)
CO2: 25 mmol/L (ref 20–29)
Calcium: 9.5 mg/dL (ref 8.7–10.2)
Chloride: 100 mmol/L (ref 96–106)
Creatinine, Ser: 0.64 mg/dL (ref 0.57–1.00)
GFR calc Af Amer: 138 mL/min/{1.73_m2} (ref >59)
GFR calc non Af Amer: 120 mL/min/{1.73_m2} (ref >59)
Globulin, Total: 2.5 g/dL (ref 1.5–4.5)
Glucose: 93 mg/dL (ref 65–99)
Potassium: 4 mmol/L (ref 3.5–5.2)
Sodium: 139 mmol/L (ref 134–144)
Total Protein: 7.3 g/dL (ref 6.0–8.5)

## 2019-06-13 LAB — CBC WITH DIFFERENTIAL/PLATELET
Basophils Absolute: 0 10*3/uL (ref 0.0–0.2)
Basos: 1 %
EOS (ABSOLUTE): 0.1 10*3/uL (ref 0.0–0.4)
Eos: 1 %
Hematocrit: 39.7 % (ref 34.0–46.6)
Hemoglobin: 13.7 g/dL (ref 11.1–15.9)
Immature Grans (Abs): 0 10*3/uL (ref 0.0–0.1)
Immature Granulocytes: 0 %
Lymphocytes Absolute: 1.3 10*3/uL (ref 0.7–3.1)
Lymphs: 31 %
MCH: 29.7 pg (ref 26.6–33.0)
MCHC: 34.5 g/dL (ref 31.5–35.7)
MCV: 86 fL (ref 79–97)
Monocytes Absolute: 0.3 10*3/uL (ref 0.1–0.9)
Monocytes: 8 %
Neutrophils Absolute: 2.5 10*3/uL (ref 1.4–7.0)
Neutrophils: 59 %
Platelets: 293 10*3/uL (ref 150–450)
RBC: 4.62 x10E6/uL (ref 3.77–5.28)
RDW: 12.3 % (ref 11.7–15.4)
WBC: 4.2 10*3/uL (ref 3.4–10.8)

## 2019-06-13 LAB — TSH: TSH: 1.36 u[IU]/mL (ref 0.450–4.500)

## 2019-06-13 LAB — PREALBUMIN: PREALBUMIN: 25 mg/dL (ref 14–35)

## 2019-06-13 LAB — T4, FREE: Free T4: 1.25 ng/dL (ref 0.82–1.77)

## 2019-09-23 ENCOUNTER — Other Ambulatory Visit: Payer: Self-pay

## 2019-09-23 ENCOUNTER — Ambulatory Visit (INDEPENDENT_AMBULATORY_CARE_PROVIDER_SITE_OTHER): Payer: Self-pay | Admitting: Family Medicine

## 2019-09-23 ENCOUNTER — Encounter: Payer: Self-pay | Admitting: Family Medicine

## 2019-09-23 VITALS — BP 110/70 | HR 73 | Temp 98.2°F | Wt 90.4 lb

## 2019-09-23 DIAGNOSIS — R590 Localized enlarged lymph nodes: Secondary | ICD-10-CM

## 2019-09-23 NOTE — Patient Instructions (Signed)

## 2019-09-23 NOTE — Progress Notes (Signed)
   Subjective:    Patient ID: Terry Paul, female    DOB: 07-17-1991, 29 y.o.   MRN: [id number]  HPI Chief Complaint  Patient presents with  . knot under neck    knot left side of neck. noticed it saturday. painful to touch it   Here with complaints of a knot on the left side of her neck for the past 4 days.  States it has been painful and TTP. Today it is not as painful.  No other areas of concern.   Denies fever, chills, night sweats, fatigue, unexplained weight loss, sore throat, ear pain, N/V/D.  No rash or arthralgias.   States she has her "usual" upper respiratory allergy symptoms but these are unchanged.   She does report an incoming wisdom tooth but denies any tooth or gum pain.     LMP: 1 1/2 weeks ago   Reviewed allergies, medications, past medical, surgical, family, and social history.   Review of Systems Pertinent positives and negatives in the history of present illness.     Objective:   Physical Exam BP 110/70   Pulse 73   Temp 98.2 F (36.8 C)   Wt 90 lb 6.4 oz (41 kg)   SpO2 99%   BMI 17.66 kg/m   Alert and in no distress. Tympanic membranes and canals are normal. Neck is supple with an enlarged left anterior cervical lymph node enlarged and TTP. Otherwise, no lymphadenopathy or thyromegaly.       Assessment & Plan:  Enlarged lymph node in neck no red flag symptoms.  Discussed that there is no obvious explanation for her swollen lymph node. We will continue monitoring this and she will let me know if she develops any other swollen nodes or if she is not improving significantly.

## 2019-10-07 ENCOUNTER — Encounter: Payer: Self-pay | Admitting: Family Medicine

## 2021-09-30 ENCOUNTER — Other Ambulatory Visit: Payer: Self-pay

## 2021-09-30 ENCOUNTER — Ambulatory Visit: Payer: No Typology Code available for payment source

## 2021-09-30 ENCOUNTER — Ambulatory Visit
Admission: EM | Admit: 2021-09-30 | Discharge: 2021-09-30 | Disposition: A | Discharge status: Home / Self Care | Payer: No Typology Code available for payment source | Attending: Family Medicine | Admitting: Family Medicine

## 2021-09-30 ENCOUNTER — Encounter: Payer: Self-pay | Admitting: Emergency Medicine

## 2021-09-30 DIAGNOSIS — N760 Acute vaginitis: Secondary | ICD-10-CM | POA: Insufficient documentation

## 2021-09-30 MED ORDER — NYSTATIN 100000 UNIT/GM EX CREA: TOPICAL_CREAM | CUTANEOUS | 0 refills | Status: DC

## 2021-09-30 MED ORDER — METRONIDAZOLE 500 MG PO TABS: 500.0000 mg | ORAL_TABLET | Freq: Two times a day (BID) | ORAL | 0 refills | Status: DC

## 2021-09-30 NOTE — ED Provider Notes (Signed)
[facility]    CSN: [id number] Arrival date & time: 09/30/21  0807      History   Chief Complaint No chief complaint on file.   HPI Terry Paul is a 31 y.o. female.   Patient presenting today with about a month of vaginal itching, irritation, redness.  She denies any abnormal discharge, odor, abdominal or pelvic pain, dysuria, hematuria.  She has been trying Monistat which may provide very mild short-term relief and she took 2 Diflucan tablets that she got from a televisit that she did several weeks ago with no lasting relief.  She does use feminine washes but has for quite some time.  Otherwise no new medications, products and no new sexual partners.  LMP 09/07/2021.   Past Medical History:  Diagnosis Date   Preeclampsia    UTI (lower urinary tract infection)     Patient Active Problem List   Diagnosis Date Noted   Irregular menses 06/12/2019   Underweight 06/12/2019   Family history of thyroid disease in mother 06/12/2019   Environmental and seasonal allergies 06/12/2019   Status post primary low transverse cesarean section 05/01/2013    Class: Status post   Twin pregnancy delivered 05/01/2013    Class: Status post   Twin pregnancy, twins discordant in third trimester 05/01/2013    Class: Present on Admission    Past Surgical History:  Procedure Laterality Date   CESAREAN SECTION N/A 04/26/2013   Procedure: Primary cesarean section  with delivery of twin  A girl at 26. Twin B girl at 62.;  Surgeon: Marylynn Pearson, MD;  Location: [facility];  Service: Obstetrics;  Laterality: N/A;    OB History     Gravida  1   Para  1   Term      Preterm  1   AB      Living  2      SAB      IAB      Ectopic      Multiple  1   Live Births  2            Home Medications    Prior to Admission medications   Medication Sig Start Date End Date Taking? Authorizing Provider  metroNIDAZOLE (FLAGYL) 500 MG tablet Take 1 tablet (500 mg total) by  mouth 2 (two) times daily. 09/30/21  Yes Volney American, PA-C  nystatin cream (MYCOSTATIN) Apply to affected area 2 times daily 09/30/21  Yes Volney American, PA-C    Family History Family History  Problem Relation Age of Onset   Healthy Mother    Thyroid disease Mother    Healthy Father     Social History Social History   Tobacco Use   Smoking status: Never   Smokeless tobacco: Never  Substance Use Topics   Alcohol use: Never   Drug use: No     Allergies   Miconazole   Review of Systems Review of Systems PER HPI  Physical Exam Triage Vital Signs ED Triage Vitals  Enc Vitals Group     BP 09/30/21 0820 135/89     Pulse Rate 09/30/21 0820 95     Resp 09/30/21 0820 16     Temp 09/30/21 0820 98.4 F (36.9 C)     Temp Source 09/30/21 0820 Oral     SpO2 09/30/21 0820 100 %     Weight --      Height --      Head Circumference --  Peak Flow --      Pain Score 09/30/21 0825 0     Pain Loc --      Pain Edu? --      Excl. in [place]? --    No data found.  Updated Vital Signs BP 135/89 (BP Location: Right Arm)    Pulse 95    Temp 98.4 F (36.9 C) (Oral)    Resp 16    LMP 09/07/2021 (Approximate)    SpO2 100%   Visual Acuity Right Eye Distance:   Left Eye Distance:   Bilateral Distance:    Right Eye Near:   Left Eye Near:    Bilateral Near:     Physical Exam Vitals and nursing note reviewed. Exam conducted with a chaperone present.  Constitutional:      Appearance: Normal appearance. She is not ill-appearing.  HENT:     Head: Atraumatic.     Mouth/Throat:     Mouth: Mucous membranes are moist.  Eyes:     Extraocular Movements: Extraocular movements intact.     Conjunctiva/sclera: Conjunctivae normal.  Cardiovascular:     Rate and Rhythm: Normal rate and regular rhythm.     Heart sounds: Normal heart sounds.  Pulmonary:     Effort: Pulmonary effort is normal.     Breath sounds: Normal breath sounds.  Abdominal:     General: Bowel  sounds are normal. There is no distension.     Palpations: Abdomen is soft.     Tenderness: There is no abdominal tenderness. There is no guarding.  Genitourinary:    Comments: Bilateral labial and vaginal canal erythema, mild.  No obvious lesions.  White thin discharge present Musculoskeletal:        General: Normal range of motion.     Cervical back: Normal range of motion and neck supple.  Skin:    General: Skin is warm and dry.  Neurological:     Mental Status: She is alert and oriented to person, place, and time.  Psychiatric:        Mood and Affect: Mood normal.        Thought Content: Thought content normal.        Judgment: Judgment normal.     UC Treatments / Results  Labs (all labs ordered are listed, but only abnormal results are displayed) Labs Reviewed  CERVICOVAGINAL ANCILLARY ONLY    EKG   Radiology No results found.  Procedures Procedures (including critical care time)  Medications Ordered in UC Medications - No data to display  Initial Impression / Assessment and Plan / UC Course  I have reviewed the triage vital signs and the nursing notes.  Pertinent labs & imaging results that were available during my care of the patient were reviewed by me and considered in my medical decision making (see chart for details).     Will cover with nystatin cream, Flagyl and await vaginal swab results.  Discussed boric acid, avoidance of feminine washes or scented soaps/products in this area.  GYN follow-up for recheck.  Final Clinical Impressions(s) / UC Diagnoses   Final diagnoses:  Acute vaginitis     Discharge Instructions      May try boric acid vaginal suppositories available over the counter as needed to help support a healthy pH balance    ED Prescriptions     Medication Sig Dispense Auth. Provider   nystatin cream (MYCOSTATIN) Apply to affected area 2 times daily 80 g Volney American, PA-C   metroNIDAZOLE (  FLAGYL) 500 MG tablet Take 1  tablet (500 mg total) by mouth 2 (two) times daily. 14 tablet Volney American, [place]      PDMP not reviewed this encounter.   Merrie Roof Wilder, [place] 09/30/21 [phone]

## 2021-09-30 NOTE — Discharge Instructions (Signed)
May try boric acid vaginal suppositories available over the counter as needed to help support a healthy pH balance

## 2021-09-30 NOTE — ED Triage Notes (Signed)
Vaginal itching x 1 month.  Took diflucan on Sunday and Tuesday night without relief.  States area is red.  Has been using a vaginal cream as well without relief.  Worse at night time

## 2021-10-03 LAB — CERVICOVAGINAL ANCILLARY ONLY
Bacterial Vaginitis (gardnerella): NEGATIVE
Candida Glabrata: NEGATIVE
Candida Vaginitis: NEGATIVE
Comment: NEGATIVE
Comment: NEGATIVE
Comment: NEGATIVE

## 2021-10-06 ENCOUNTER — Ambulatory Visit: Payer: Self-pay | Admitting: Family Medicine

## 2021-10-09 NOTE — Progress Notes (Signed)
Chief Complaint  Patient presents with   Vaginal Itching    For 1 month with some redness. Has tried Diflucan xs 2 doses from telehealth. Then saw Urgent care on 09/30/21 was prescribed Flagyl for 1 week, tested negative for BV and candida. Denies vaginal discharge and odor    She went to UC on 1/13 with c/o vaginal itching, irritation x 1 month.  No discharge or pelvic pain. Tried diflucan x 2, which helped, but recurred (short-term relief only).  Also tried Monistat, which helped some, but didn't go away completely. She had been using this when seen at [facility], unsure if affected the sample. She uses feminine washes, but none new. No new meds or sexual partners. Exam in UC showed thin white discharge, erythema. (She thinks the discharge was the medicine). They prescribed nystatin cream, Flagyl. Vaginal swab was sent out, which ultimately returned negative for Candida and BV.  She was advised to avoid using feminine washes or scented soaps/products in this area.   She took the flagyl (orally), but never used the nystatin cream. It got a little better, then got worse.  She had her menses last week. It was more itchy prior, less now that it is over.  Currently--it is a lot better. Still gets a little itchy occasionally, often at night Itching is externally, one spot more than others.  She reports today that she had tried a lavendar scented toilet paper. Uses some moist wipes, but no change in product. No lubricants, condoms, or other vaginal products.  PMH, PSH, SH reviewed  Husband had a vasectomy.  Outpatient Encounter Medications as of 10/10/2021  Medication Sig Note   nystatin cream (MYCOSTATIN) Apply to affected area 2 times daily (Patient not taking: Reported on 10/10/2021) 10/10/2021: Never started this   [DISCONTINUED] metroNIDAZOLE (FLAGYL) 500 MG tablet Take 1 tablet (500 mg total) by mouth 2 (two) times daily. (Patient not taking: Reported on 10/10/2021)    No facility-administered  encounter medications on file as of 10/10/2021.   Allergies  Allergen Reactions   Miconazole     itching   Recently used miconazole without problems  ROS:  no fever, chills, URI symptoms, abdominal pain, urinary complaints, vaginal discharge. Vaginal itching per HPI.  No other concerns   PHYSICAL EXAM:  BP 122/84 (BP Location: Right Arm, Patient Position: Sitting)    Pulse 84    Ht 5' (1.524 m)    Wt 92 lb 12.8 oz (42.1 kg)    LMP 10/03/2021    SpO2 100%    BMI 18.12 kg/m   Well-appearing, thin, pleasant female in no distress HEENT: conjunctiva and sclera are clear, EOMI, wearing mask Heart: regular rate and rhythm Lungs: clear  Back: no CVA tenderness Abdomen: soft, nontneder GU:  normal external genitalia.  There is minimal erythma noted laterally (labia minora) and posteriorly, with some very minor/superficial erosion/abrasion present (pt admits to rubbing/scratching when itchy).  No soft tissue swelling, sores, or abnormal discharge noted.  ASSESSMENT/PLAN:  Vaginal itching - external, with no discharge. suspect irritant, likely from scented TP. Overall much better. Can use Vagisil for irritation vs barrier until healed (ie znox)  All results reviewed with pt.  Exam not concerning for any yeast or other infection. Pt reassured.   Has [employer], doesn't cover preventative visits. Son is 5, poss no paps since then, her GYN retired Encouraged to schedule CPE (with pap) with new provider.

## 2021-10-10 ENCOUNTER — Encounter: Payer: Self-pay | Admitting: Family Medicine

## 2021-10-10 ENCOUNTER — Ambulatory Visit: Payer: Self-pay | Admitting: Family Medicine

## 2021-10-10 ENCOUNTER — Other Ambulatory Visit: Payer: Self-pay

## 2021-10-10 VITALS — BP 122/84 | HR 84 | Ht 60.0 in | Wt 92.8 lb

## 2021-10-10 DIAGNOSIS — N898 Other specified noninflammatory disorders of vagina: Secondary | ICD-10-CM

## 2021-10-10 NOTE — Patient Instructions (Signed)
Please stop using scented toilet paper.  This is likely contributing/causing your skin sensitivity.  For the small area of irritation, you can try either Vagisil (for irritation, NOT for yeast), or Desitin (a zinc oxide barrier treatment) vs vaseline ointment.  If itching and redness worsens, you can try the nystatin cream that was prescribed by urgent care that you haven't tried yet.  It sounds like you are likely due for a pap smear and full physical.  Please schedule this with Orlie Pollen at your convenience.

## 2021-10-19 ENCOUNTER — Encounter: Payer: Self-pay | Admitting: Family Medicine

## 2021-11-08 ENCOUNTER — Other Ambulatory Visit: Payer: Self-pay

## 2021-11-08 ENCOUNTER — Encounter: Payer: Self-pay | Admitting: Radiology

## 2021-11-08 ENCOUNTER — Ambulatory Visit (INDEPENDENT_AMBULATORY_CARE_PROVIDER_SITE_OTHER): Payer: Self-pay | Admitting: Radiology

## 2021-11-08 VITALS — BP 118/72 | Ht 59.75 in | Wt 92.0 lb

## 2021-11-08 DIAGNOSIS — Z01419 Encounter for gynecological examination (general) (routine) without abnormal findings: Secondary | ICD-10-CM

## 2021-11-08 NOTE — Progress Notes (Signed)
° °  Terry Paul 10/03/1990 [id number]   History:  31 y.o. G2P3 here for annual exam. No concerns. Declines pap screening, unsure of last pap date. Will obtain records from Dr Francene Boyers  Gynecologic History Patient's last menstrual period was 10/12/2021. Period Duration (Days): 7 Period Pattern: (!) Irregular Menstrual Flow: Heavy, Light Dysmenorrhea: (!) Mild Dysmenorrhea Symptoms: Cramping Contraception/Family planning: vasectomy Sexually active: yes Last Pap: unsure. Results were: normal per pt  Obstetric History OB History  Gravida Para Term Preterm AB Living  2 2 1 1   3   SAB IAB Ectopic Multiple Live Births        1 3    # Outcome Date GA Lbr Len/2nd Weight Sex Delivery Anes PTL Lv  2A Preterm 04/26/13 [redacted]w[redacted]d  4 lb 13.4 oz (2.195 kg) F CS-LTranv Spinal  LIV  2B Preterm 04/26/13 [redacted]w[redacted]d  5 lb 2.4 oz (2.335 kg) F CS-LTranv Spinal  LIV  1 Term         LIV     The following portions of the patient's history were reviewed and updated as appropriate: allergies, current medications, past family history, past medical history, past social history, past surgical history, and problem list.  Review of Systems Pertinent items noted in HPI and remainder of comprehensive ROS otherwise negative.   Past medical history, past surgical history, family history and social history were all reviewed and documented in the EPIC chart.   Exam:  Vitals:   11/08/21 1343  BP: 118/72  Weight: 92 lb (41.7 kg)  Height: 4' 11.75" (1.518 m)   Body mass index is 18.12 kg/m.  General appearance:  Normal Thyroid:  Symmetrical, normal in size, without palpable masses or nodularity. Respiratory  Auscultation:  Clear without wheezing or rhonchi Cardiovascular  Auscultation:  Regular rate, without rubs, murmurs or gallops  Edema/varicosities:  Not grossly evident Abdominal  Soft,nontender, without masses, guarding or rebound.  Liver/spleen:  No organomegaly noted  Hernia:  None appreciated   Skin  Inspection:  Grossly normal Breasts: Examined lying and sitting.   Right: Without masses, retractions, nipple discharge or axillary adenopathy.   Left: Without masses, retractions, nipple discharge or axillary adenopathy. Genitourinary   Inguinal/mons:  Normal without inguinal adenopathy  External genitalia:  Normal appearing vulva with no masses, tenderness, or lesions  BUS/Urethra/Skene's glands:  Normal without masses or exudate  Vagina:  Normal appearing with normal color and discharge, no lesions  Cervix:  Normal appearing without discharge or lesions  Uterus:  Normal in size, shape and contour.  Mobile, nontender  Adnexa/parametria:     Rt: Normal in size, without masses or tenderness.   Lt: Normal in size, without masses or tenderness.  Anus and perineum: Normal   Patient informed chaperone available to be present for breast and pelvic exam. Patient has requested no chaperone to be present. Patient has been advised what will be completed during breast and pelvic exam.   Assessment/Plan:   -Well woman exam -Pap unsure of date, will obtain records and repeat with AEX 2024 -Mammo at 38    Discussed SBE, pap colonoscopy and DEXA screening as directed/appropriate. Recommend 2025 of exercise weekly, including weight bearing exercise. Encouraged the use of seatbelts and sunscreen. Return in 1 year for annual or as needed.   B WHNP-BC 2:15 PM 11/08/2021

## 2021-11-23 ENCOUNTER — Encounter: Payer: No Typology Code available for payment source | Admitting: Obstetrics and Gynecology

## 2021-12-28 ENCOUNTER — Encounter: Payer: No Typology Code available for payment source | Admitting: Obstetrics and Gynecology

## 2022-11-20 ENCOUNTER — Ambulatory Visit (INDEPENDENT_AMBULATORY_CARE_PROVIDER_SITE_OTHER): Payer: PRIVATE HEALTH INSURANCE | Admitting: Family Medicine

## 2022-11-20 ENCOUNTER — Encounter: Payer: Self-pay | Admitting: Family Medicine

## 2022-11-20 VITALS — BP 120/78 | HR 91 | Temp 97.6°F | Ht 59.0 in | Wt 95.4 lb

## 2022-11-20 DIAGNOSIS — M5432 Sciatica, left side: Secondary | ICD-10-CM | POA: Diagnosis not present

## 2022-11-20 DIAGNOSIS — G5702 Lesion of sciatic nerve, left lower limb: Secondary | ICD-10-CM | POA: Insufficient documentation

## 2022-11-20 NOTE — Assessment & Plan Note (Signed)
Left-sided foot and calf numbness and tingling. Normal exam in office today. Symptoms are positional. Recent MRI spine and brain negative. Upcoming neurology appointment. Has tried Medrol pack in past without relief.

## 2022-11-20 NOTE — Patient Instructions (Signed)
It was great to meet you today and I'm excited to have you join the [place] practice. I hope you had a positive experience today! If you feel so inclined, please feel free to recommend our practice to friends and family. Mila Merry, FNP-C

## 2022-11-20 NOTE — Progress Notes (Signed)
New Patient Office Visit  Subjective    Patient ID: Terry Paul, female    DOB: 1990/09/19  Age: 32 y.o. MRN: [id number]  CC: No chief complaint on file.   HPI Raksha Najafi presents to establish care. Oriented to practice routines and expectations. PMH includes preeclampsia with first pregnancy and postpartum, she has 3 children. Last physical with labs not within the last year. Concerns today include left leg numbness intermittent for several years that has been worsening over the last two months. It is worse with tighter clothes, sitting, lying on left side. Denies weakness. Denies saddle numbness, urinary incontinence, stool incontinence. She was worked up in [place] where she has been staying the last 5 months with MRI spine and brain, both normal. She does have a neurology appointment in April. Her insurance does not cover preventative items so she declines physical with labs today.  Cervical CA: OBGYN STD: declines Vaccines: UTD Smoker: non-smoker BMI: 19.27   No outpatient encounter medications on file as of 11/20/2022.   No facility-administered encounter medications on file as of 11/20/2022.    Past Medical History:  Diagnosis Date   Preeclampsia    UTI (lower urinary tract infection)     Past Surgical History:  Procedure Laterality Date   CESAREAN SECTION N/A 04/26/2013   Procedure: Primary cesarean section  with delivery of twin  A girl at 42. Twin B girl at 30.;  Surgeon: Marylynn Pearson, MD;  Location: [facility];  Service: Obstetrics;  Laterality: N/A;    Family History  Problem Relation Age of Onset   Healthy Mother    Thyroid disease Mother    Healthy Father     Social History   Socioeconomic History   Marital status: Married    Spouse name: Not on file   Number of children: Not on file   Years of education: Not on file   Highest education level: Not on file  Occupational History   Not on file  Tobacco Use   Smoking status: Never   Smokeless tobacco: Never   Substance and Sexual Activity   Alcohol use: Never   Drug use: No   Sexual activity: Yes    Partners: Male    Birth control/protection: Other-see comments    Comment: partner had vasectomy  Other Topics Concern   Not on file  Social History Narrative   Not on file   Social Determinants of Health   Financial Resource Strain: Not on file  Food Insecurity: Not on file  Transportation Needs: Not on file  Physical Activity: Not on file  Stress: Not on file  Social Connections: Not on file  Intimate Partner Violence: Not on file    Review of Systems  Constitutional: Negative.   Respiratory: Negative.    Cardiovascular: Negative.   Neurological: Negative.   All other systems reviewed and are negative.       Objective    BP 120/78   Pulse 91   Temp 97.6 F (36.4 C) (Oral)   Ht '4\' 11"'$  (1.499 m)   Wt 95 lb 6.4 oz (43.3 kg)   LMP 10/14/2022   SpO2 100%   BMI 19.27 kg/m   Physical Exam Vitals and nursing note reviewed.  Constitutional:      Appearance: Normal appearance. She is normal weight.  HENT:     Head: Normocephalic and atraumatic.  Cardiovascular:     Rate and Rhythm: Normal rate and regular rhythm.     Pulses: Normal pulses.  Heart sounds: Normal heart sounds.  Pulmonary:     Effort: Pulmonary effort is normal.     Breath sounds: Normal breath sounds.  Skin:    General: Skin is warm and dry.  Neurological:     General: No focal deficit present.     Mental Status: She is alert and oriented to person, place, and time. Mental status is at baseline.  Psychiatric:        Mood and Affect: Mood normal.        Behavior: Behavior normal.        Thought Content: Thought content normal.        Judgment: Judgment normal.         Assessment & Plan:   Problem List Items Addressed This Visit       Nervous and Auditory   Left sided sciatica - Primary    Left-sided foot and calf numbness and tingling. Normal exam in office today. Symptoms are  positional. Recent MRI spine and brain negative. Upcoming neurology appointment. Has tried Medrol pack in past without relief.       Return in about 1 year (around 11/20/2023) for annual physical.   Rubie Maid, FNP

## 2022-11-22 ENCOUNTER — Ambulatory Visit: Payer: Self-pay | Admitting: Family Medicine

## 2022-12-27 NOTE — Progress Notes (Addendum)
[facility]  PATIENT: Terry Paul DOB: 1991/07/14  REFERRING DOCTOR OR PCP: Michae Kava, PA-C SOURCE: Patient, notes from primary care, imaging reports.  _________________________________   HISTORICAL  CHIEF COMPLAINT:  Chief Complaint  Patient presents with   new patinet     Pt in room 10. New patient here for Worsening left leg/ toe numbness and tingling had this for a couple of year, but lately she has noticed it more. States feels burning when wearing jeans at time or regular clothes.     HISTORY OF PRESENT ILLNESS:  I had the pleasure seeing your patient, Terry Paul, at [facility] for neurologic consultation regarding her left leg numbness and dysesthesias.  [name] is a 32 year old woman reporting left leg numbness intermittently for the past couple of years.   Symptoms started with numbness that was annoying but not painful.   More recently, at the end of 2023, symptoms worsened with more burning dysesthetic sensation and also more tingling in the toes.   Wearing tight clothes or sleeping on her left side or dangling her legs worsened the pain.   She denies any difficulty with strength and gait was mostly fine (a little worse when pain was increased).   Balance is fine.   She now wears sweats mostly since jeans are more painful.    She notes no change in bladder.   No numbness in her groin or above the buttock.   No change in vision.     She was prescbed a steroid pack without benefit.     She had MRI of the lumbar spine and brain in [place] 10/24/2022 and it was normal.  She is healthy and not on any medciations  She denies any history of trauma.  With pregnancy (twins) in 2015, she had left leg tingling .  This improved shortly after delivery.      She was evaluated in [place] in 2023 MRI of the spine and the brain and was told that they were normal.  REVIEW OF SYSTEMS: Constitutional: No fevers, chills, sweats, or  change in appetite Eyes: No visual changes, double vision, eye pain Ear, nose and throat: No hearing loss, ear pain, nasal congestion, sore throat Cardiovascular: No chest pain, palpitations Respiratory:  No shortness of breath at rest or with exertion.   No wheezes GastrointestinaI: No nausea, vomiting, diarrhea, abdominal pain, fecal incontinence Genitourinary:  No dysuria, urinary retention or frequency.  No nocturia. Musculoskeletal:  No neck pain, back pain Integumentary: No rash, pruritus, skin lesions Neurological: as above Psychiatric: No depression at this time.  No anxiety Endocrine: No palpitations, diaphoresis, change in appetite, change in weigh or increased thirst Hematologic/Lymphatic:  No anemia, purpura, petechiae. Allergic/Immunologic: No itchy/runny eyes, nasal congestion, recent allergic reactions, rashes  ALLERGIES: No Known Allergies  HOME MEDICATIONS:  Current Outpatient Medications:    gabapentin (NEURONTIN) 300 MG capsule, Take 1 capsule (300 mg total) by mouth 3 (three) times daily., Disp: 90 capsule, Rfl: 5  PAST MEDICAL HISTORY: Past Medical History:  Diagnosis Date   Preeclampsia    UTI (lower urinary tract infection)     PAST SURGICAL HISTORY: Past Surgical History:  Procedure Laterality Date   CESAREAN SECTION N/A 04/26/2013   Procedure: Primary cesarean section  with delivery of twin  A girl at 21. Twin B girl at 69.;  Surgeon: Zelphia Cairo, MD;  Location: [facility];  Service: Obstetrics;  Laterality: N/A;    FAMILY HISTORY: Family History  Problem Relation  Age of Onset   Healthy Mother    Healthy Father     SOCIAL HISTORY: Social History   Socioeconomic History   Marital status: Married    Spouse name: Not on file   Number of children: 3   Years of education: Not on file   Highest education level: Not on file  Occupational History   Not on file  Tobacco Use   Smoking status: Never   Smokeless tobacco: Never  Vaping Use    Vaping Use: Never used  Substance and Sexual Activity   Alcohol use: Never   Drug use: No   Sexual activity: Yes    Partners: Male    Birth control/protection: Other-see comments    Comment: partner had vasectomy  Other Topics Concern   Not on file  Social History Narrative   Left handed    Drinks coffee daily    Social Determinants of Health   Financial Resource Strain: Not on file  Food Insecurity: Not on file  Transportation Needs: Not on file  Physical Activity: Not on file  Stress: Not on file  Social Connections: Not on file  Intimate Partner Violence: Not on file       PHYSICAL EXAM  Vitals:   12/28/22 1005  BP: 128/80  Pulse: 81  Weight: 91 lb 3.2 oz (41.4 kg)  Height: 4' 11.5" (1.511 m)    Body mass index is 18.11 kg/m.   General: The patient is well-developed and well-nourished and in no acute distress  HEENT:  Head is [place]/AT.  Sclera are anicteric.  Funduscopic exam shows normal optic discs and retinal vessels.  Neck: No carotid bruits are noted.  The neck is nontender.  Cardiovascular: The heart has a regular rate and rhythm with a normal S1 and S2. There were no murmurs, gallops or rubs.    Skin: Extremities are without rash or  edema.  Musculoskeletal:  Back is nontender  Neurologic Exam  Mental status: The patient is alert and oriented x 3 at the time of the examination. The patient has apparent normal recent and remote memory, with an apparently normal attention span and concentration ability.   Speech is normal.  Cranial nerves: Extraocular movements are full. Pupils are equal, round, and reactive to light and accomodation.  Facial symmetry is present. There is good facial sensation to soft touch bilaterally.Facial strength is normal.  Trapezius and sternocleidomastoid strength is normal. No dysarthria is noted.  The tongue is midline, and the patient has symmetric elevation of the soft palate. No obvious hearing deficits are noted.  Motor:   Muscle bulk is normal.   Tone is normal. Strength is  5 / 5 in all 4 extremities.   Sensory: Sensory testing is intact to pinprick, soft touch and vibration sensation in the arms and right leg.  However, she had mildly reduced sensation to pinprick and temperature in the left leg relative to the right.  Additionally, vibration sensation was very slightly less felt on the left than the right.  Coordination: Cerebellar testing reveals good finger-nose-finger and heel-to-shin bilaterally.  Gait and station: Station is normal.   Gait is normal. Tandem gait is normal. Romberg is negative.   Reflexes: Deep tendon reflexes are symmetric and normal bilaterally.   Plantar responses are flexor.    DIAGNOSTIC DATA (LABS, IMAGING, TESTING) - I reviewed patient records, labs, notes, testing and imaging myself where available.  Lab Results  Component Value Date   WBC 4.2 06/12/2019   HGB  13.7 06/12/2019   HCT 39.7 06/12/2019   MCV 86 06/12/2019   PLT 293 06/12/2019      Component Value Date/Time   NA 139 06/12/2019 1149   K 4.0 06/12/2019 1149   CL 100 06/12/2019 1149   CO2 25 06/12/2019 1149   GLUCOSE 93 06/12/2019 1149   GLUCOSE 104 (H) 04/30/2013 0515   BUN 9 06/12/2019 1149   CREATININE 0.64 06/12/2019 1149   CALCIUM 9.5 06/12/2019 1149   PROT 7.3 06/12/2019 1149   ALBUMIN 4.8 06/12/2019 1149   AST 16 06/12/2019 1149   ALT 11 06/12/2019 1149   ALKPHOS 68 06/12/2019 1149   BILITOT 0.3 06/12/2019 1149   GFRNONAA 120 06/12/2019 1149   GFRAA 138 06/12/2019 1149    Lab Results  Component Value Date   TSH 1.360 06/12/2019       ASSESSMENT AND PLAN  Left leg numbness - Plan: MR THORACIC SPINE WO CONTRAST, MR CERVICAL SPINE WO CONTRAST  Dysesthesia - Plan: MR THORACIC SPINE WO CONTRAST, MR CERVICAL SPINE WO CONTRAST   In summary, Ms. Luz BrazenHartman is a 32 year old woman with left leg numbness and dysesthesias.  Although some numbness have been present for 2 years, symptoms  intensified fairly suddenly a couple months ago.  The etiology is uncertain.  Given her age, demyelination needs to be most considered.  The MRI of the brain was normal making this less likely.  However, symptoms could be consistent with transverse myelitis.  We need to check an MRI of the lower cervical and thoracic spine.  I discussed with her that if she does have evidence of a transverse myelitis and given that the MRI of the brain was normal, there would still about a 25% chance that it could be MS.  However, if the MRI of the spinal cord does not show any lesions, then the likelihood is much less.  Depending on the results further evaluation may also be necessary.  We discussed symptomatic treatment of the dysesthesias.  She has not tried anything except for the steroid pack.  I gave her a prescription for gabapentin 300 mg p.o. 3 times daily.  We also discussed that if she cannot tolerate it or if she does not get a good benefit I would try lamotrigine and titrate up to 50 mg twice a day.  We also discussed that taking medications for the dysesthesia is optional and she could hold off if she prefers.  She will return to see me in 5 months for regular visit but based on the results of the MRIs this might be modified.  Additionally she should call if she has any new neurologic symptoms or significant worsenin.   Willadene Mounsey A. Epimenio FootSater, MD, [facility],FAAN 12/28/2022, 11:33 AM Certified in Neurology, Clinical Neurophysiology, Sleep Medicine and Neuroimaging  [facility] [address], [address] [place], [place] [id number] [phone]

## 2022-12-28 ENCOUNTER — Encounter: Payer: Self-pay | Admitting: Neurology

## 2022-12-28 ENCOUNTER — Ambulatory Visit (INDEPENDENT_AMBULATORY_CARE_PROVIDER_SITE_OTHER): Payer: PRIVATE HEALTH INSURANCE | Admitting: Neurology

## 2022-12-28 VITALS — BP 128/80 | HR 81 | Ht 59.5 in | Wt 91.2 lb

## 2022-12-28 DIAGNOSIS — R200 Anesthesia of skin: Secondary | ICD-10-CM

## 2022-12-28 DIAGNOSIS — R208 Other disturbances of skin sensation: Secondary | ICD-10-CM

## 2022-12-28 MED ORDER — GABAPENTIN 300 MG PO CAPS: 300.0000 mg | ORAL_CAPSULE | Freq: Three times a day (TID) | ORAL | 5 refills | Status: DC

## 2023-01-01 ENCOUNTER — Encounter: Payer: Self-pay | Admitting: Neurology

## 2023-01-05 LAB — LAB REPORT - SCANNED: EGFR (Non-African Amer.): 111

## 2023-01-08 ENCOUNTER — Telehealth: Payer: Self-pay | Admitting: Neurology

## 2023-01-08 NOTE — Telephone Encounter (Signed)
Patient would like to be self-pay, sent to [facility] [phone]

## 2023-01-08 NOTE — Telephone Encounter (Signed)
Per patient request, I will send the MRIs to [facility] for an open MRI. [phone]

## 2023-01-21 ENCOUNTER — Ambulatory Visit
Admission: RE | Admit: 2023-01-21 | Discharge: 2023-01-21 | Disposition: A | Discharge status: Home / Self Care | Payer: Self-pay | Source: Ambulatory Visit | Attending: Neurology | Admitting: Neurology

## 2023-01-21 DIAGNOSIS — R208 Other disturbances of skin sensation: Secondary | ICD-10-CM

## 2023-01-21 DIAGNOSIS — R200 Anesthesia of skin: Secondary | ICD-10-CM | POA: Diagnosis not present

## 2023-01-22 ENCOUNTER — Encounter: Payer: Self-pay | Admitting: Neurology

## 2023-02-05 ENCOUNTER — Ambulatory Visit: Payer: PRIVATE HEALTH INSURANCE | Admitting: Family Medicine

## 2023-02-05 ENCOUNTER — Encounter: Payer: Self-pay | Admitting: Family Medicine

## 2023-02-05 VITALS — BP 124/82 | HR 75 | Ht 59.0 in | Wt 91.0 lb

## 2023-02-05 DIAGNOSIS — M5432 Sciatica, left side: Secondary | ICD-10-CM | POA: Diagnosis not present

## 2023-02-05 DIAGNOSIS — M9904 Segmental and somatic dysfunction of sacral region: Secondary | ICD-10-CM

## 2023-02-05 DIAGNOSIS — M9903 Segmental and somatic dysfunction of lumbar region: Secondary | ICD-10-CM | POA: Diagnosis not present

## 2023-02-05 NOTE — Progress Notes (Signed)
[facility] [address] [place] [address] Phone: [phone] Subjective:   [name], am serving as a scribe for Dr. Antoine Primas.  I'm seeing this patient by the request  of:  Kurtis Bushman S, FNP  CC: Left leg and foot numbness.  [id number]  Terry Paul is a 32 y.o. female coming in with complaint of left leg and foot numbness. Sometimes the radiating sensation is burning in nature. Sensation can be felt when sitting or having pressure on leg. R leg is starting to have similar issue. Does not wake at night from feeling, but if she sleeps on L side does get sensation.  Starts in gluteal and radiates to toes. Has been seen by neurology recently.  Patient has had fairly extensive workup including MRI of the cervical spine was independently visualized by me showing just a small disc protrusion at C5-C6 but no significant nerve root compression, MRI of the thoracic spine is unremarkable.  Patient is also had laboratory workup recently including Lyme disease sedimentation rate that were unremarkable.  Medications patient is taking his gabapentin 300 mg.  When reviewing neurology's notes it appears that there was an MRI of the lumbar spine and the brain back in February 2024 that was normal.    Past Medical History:  Diagnosis Date   Preeclampsia    UTI (lower urinary tract infection)    Past Surgical History:  Procedure Laterality Date   CESAREAN SECTION N/A 04/26/2013   Procedure: Primary cesarean section  with delivery of twin  A girl at 5. Twin B girl at 78.;  Surgeon: Zelphia Cairo, MD;  Location: [facility];  Service: Obstetrics;  Laterality: N/A;   Social History   Socioeconomic History   Marital status: Married    Spouse name: Not on file   Number of children: 3   Years of education: Not on file   Highest education level: Not on file  Occupational History   Not on file  Tobacco Use   Smoking status: Never   Smokeless  tobacco: Never  Vaping Use   Vaping Use: Never used  Substance and Sexual Activity   Alcohol use: Never   Drug use: No   Sexual activity: Yes    Partners: Male    Birth control/protection: Other-see comments    Comment: partner had vasectomy  Other Topics Concern   Not on file  Social History Narrative   Left handed    Drinks coffee daily    Social Determinants of Health   Financial Resource Strain: Not on file  Food Insecurity: Not on file  Transportation Needs: Not on file  Physical Activity: Not on file  Stress: Not on file  Social Connections: Not on file   No Known Allergies Family History  Problem Relation Age of Onset   Healthy Mother    Healthy Father          Current Outpatient Medications (Other):    gabapentin (NEURONTIN) 300 MG capsule, Take 1 capsule (300 mg total) by mouth 3 (three) times daily.   Reviewed prior external information including notes and imaging from  primary care provider As well as notes that were available from care everywhere and other healthcare systems.  Past medical history, social, surgical and family history all reviewed in electronic medical record.  No pertanent information unless stated regarding to the chief complaint.   Review of Systems:  No headache, visual changes, nausea, vomiting, diarrhea, constipation, dizziness, abdominal pain, skin  rash, fevers, chills, night sweats, weight loss, swollen lymph nodes, body aches, joint swelling, chest pain, shortness of breath, mood changes. POSITIVE muscle aches  Objective  Blood pressure 124/82, pulse 75, height 4\' 11"  (1.499 m), weight 91 lb (41.3 kg), SpO2 98 %.   General: No apparent distress alert and oriented x3 mood and affect normal, dressed appropriately.  HEENT: Pupils equal, extraocular movements intact  Respiratory: Patient's speak in full sentences and does not appear short of breath  Cardiovascular: No lower extremity edema, non tender, no erythema  Low back  exam shows very mild scoliosis noted.  Patient does have severe tightness noted of the left greater than right FABER test.  Severe tenderness to palpation in the piriformis area in the gluteal area.  Negative straight leg test noted today.  Leg length discrepancy noted with left leg being 1/8 of an inch longer  Osteopathic findings L3 flexed rotated and side bent right Sacrum left on left   Impression and Recommendations:     The above documentation has been reviewed and is accurate and complete Judi Saa, DO

## 2023-02-05 NOTE — Assessment & Plan Note (Addendum)
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  I do think the patient has a leg length discrepancy and the pelvic shear noted and may be even potentially secondary to patient's twin pregnancy.  We discussed with patient about the leg length discrepancy as well.  Increase activity slowly otherwise.  Follow-up again in 6 to 8 weeks we did discuss if necessary on low-dose of gabapentin could be done as well.

## 2023-02-05 NOTE — Assessment & Plan Note (Signed)
   Decision today to treat with OMT was based on Physical Exam  After verbal consent patient was treated with HVLA, ME techniques in lumbar and sacral areas, all areas are chronic   Patient tolerated the procedure well with improvement in symptoms  Patient given exercises, stretches and lifestyle modifications  See medications in patient instructions if given  Patient will follow up in 4-8 weeks 

## 2023-02-05 NOTE — Patient Instructions (Addendum)
Tennis ball in back pocket on right side [employer] insole right shoe OOFOs Recovery in the house See me again in 6-8 weeks

## 2023-02-08 ENCOUNTER — Encounter: Payer: Self-pay | Admitting: Family Medicine

## 2023-02-14 ENCOUNTER — Ambulatory Visit: Payer: PRIVATE HEALTH INSURANCE | Admitting: Nurse Practitioner

## 2023-02-16 ENCOUNTER — Ambulatory Visit: Payer: PRIVATE HEALTH INSURANCE | Admitting: Family Medicine

## 2023-02-19 ENCOUNTER — Encounter: Payer: PRIVATE HEALTH INSURANCE | Admitting: Neurology

## 2023-02-19 ENCOUNTER — Ambulatory Visit: Payer: PRIVATE HEALTH INSURANCE | Admitting: Family Medicine

## 2023-04-02 ENCOUNTER — Ambulatory Visit (INDEPENDENT_AMBULATORY_CARE_PROVIDER_SITE_OTHER): Payer: Self-pay | Admitting: Family Medicine

## 2023-04-02 ENCOUNTER — Encounter: Payer: Self-pay | Admitting: Family Medicine

## 2023-04-02 ENCOUNTER — Ambulatory Visit: Payer: PRIVATE HEALTH INSURANCE

## 2023-04-02 ENCOUNTER — Other Ambulatory Visit: Payer: Self-pay

## 2023-04-02 VITALS — BP 122/70 | HR 74 | Ht 59.0 in | Wt 92.0 lb

## 2023-04-02 DIAGNOSIS — M5432 Sciatica, left side: Secondary | ICD-10-CM | POA: Diagnosis not present

## 2023-04-02 DIAGNOSIS — M79672 Pain in left foot: Secondary | ICD-10-CM | POA: Diagnosis not present

## 2023-04-02 DIAGNOSIS — M25552 Pain in left hip: Secondary | ICD-10-CM | POA: Diagnosis not present

## 2023-04-02 DIAGNOSIS — M2550 Pain in unspecified joint: Secondary | ICD-10-CM | POA: Diagnosis not present

## 2023-04-02 LAB — IBC PANEL
Iron: 114 ug/dL (ref 42–145)
Saturation Ratios: 29.4 % (ref 20.0–50.0)
TIBC: 387.8 ug/dL (ref 250.0–450.0)
Transferrin: 277 mg/dL (ref 212.0–360.0)

## 2023-04-02 LAB — URIC ACID: Uric Acid, Serum: 4.1 mg/dL (ref 2.4–7.0)

## 2023-04-02 LAB — FERRITIN: Ferritin: 32.6 ng/mL (ref 10.0–291.0)

## 2023-04-02 LAB — VITAMIN D 25 HYDROXY (VIT D DEFICIENCY, FRACTURES): VITD: 41.36 ng/mL (ref 30.00–100.00)

## 2023-04-02 NOTE — Patient Instructions (Addendum)
Injection today Labs today L hip xray Write me in 2 weeks and will get MRA if needed

## 2023-04-02 NOTE — Addendum Note (Signed)
Addended by: Ethlyn Daniels on: 04/02/2023 01:07 PM   Modules accepted: Orders

## 2023-04-02 NOTE — Progress Notes (Signed)
[facility] [address] [place] [address] Phone: [phone] Subjective:   Terry Paul, am serving as a scribe for Dr. Antoine Primas.  I'm seeing this patient by the request  of:  Park Meo, FNP  CC: Low back pain follow-up  PPI:[id number]  Terry Paul is a 32 y.o. female coming in with complaint of back and neck pain, patient was having more back pain and seem to have left leg numbness and some sciatica.  Patient states that she continues to have burning sensation that runs down the posterior aspect of L leg to her foot. Symptoms worsen with sitting.   Another provider prescribed gabapentin 300mg  TID. She would like to get a script for a diff dose.   Medications patient has been prescribed:   Taking:         Reviewed prior external information including notes and imaging from previsou exam, outside providers and external EMR if available.   As well as notes that were available from care everywhere and other healthcare systems.  Past medical history, social, surgical and family history all reviewed in electronic medical record.  No pertanent information unless stated regarding to the chief complaint.   Past Medical History:  Diagnosis Date   Preeclampsia    UTI (lower urinary tract infection)     No Known Allergies   Review of Systems:  No headache, visual changes, nausea, vomiting, diarrhea, constipation, dizziness, abdominal pain, skin rash, fevers, chills, night sweats, weight loss, swollen lymph nodes, body aches, joint swelling, chest pain, shortness of breath, mood changes. POSITIVE muscle aches  Objective  Blood pressure 122/70, pulse 74, height 4\' 11"  (1.499 m), weight 92 lb (41.7 kg), SpO2 99%.   General: No apparent distress alert and oriented x3 mood and affect normal, dressed appropriately.  HEENT: Pupils equal, extraocular movements intact  Respiratory: Patient's speak in full sentences and does not appear  short of breath  Cardiovascular: No lower extremity edema, non tender, no erythema  Left hip continues to have some limited range of motion especially with external range of motion.  Significant tightness like in the last 25 degrees of external rotation.  Full internal rotation of the hip noted.  Procedure: Real-time Ultrasound Guided Injection of left piriformis tendon sheath Device: GE Logiq Q7 Ultrasound guided injection is preferred based studies that show increased duration, increased effect, greater accuracy, decreased procedural pain, increased response rate, and decreased cost with ultrasound guided versus blind injection.  Verbal informed consent obtained.  Time-out conducted.  Noted no overlying erythema, induration, or other signs of local infection.  Skin prepped in a sterile fashion.  Local anesthesia: Topical Ethyl chloride.  With sterile technique and under real time ultrasound guidance: With a 21-gauge half inch needle injected with 0.5 cc of 0.5% Marcaine and 1 cc of Kenalog 40 mg/mL Completed without difficulty  Pain immediately improved suggesting accurate placement of the medication.  Advised to call if fevers/chills, erythema, induration, drainage, or persistent bleeding.  Impression: Technically successful ultrasound guided injection.      Assessment and Plan:  No problem-specific Assessment & Plan notes found for this encounter.       The above documentation has been reviewed and is accurate and complete Terry Saa, DO          Note: This dictation was prepared with Dragon dictation along with smaller phrase technology. Any transcriptional errors that result from this process are unintentional.

## 2023-04-02 NOTE — Addendum Note (Signed)
Addended by: Ethlyn Daniels on: 04/02/2023 12:58 PM   Modules accepted: Orders

## 2023-04-02 NOTE — Assessment & Plan Note (Signed)
I think it could be secondary to more of a piriformis syndrome.  Discussed icing regimen and home exercises, discussed which activities to do and which ones to avoid we discussed with patient that laboratory workup could be beneficial as well with patient having some association with her menstrual cycle.  Will consider the possibility of advanced imaging will get x-rays of the hip today.  Depending on findings we will then see how patient responds to this injection and no significant improvement I do think an MR arthrogram of the hip will be necessary to further evaluate if anything is intra-articular.  Follow-up with me again 6 to 8 weeks otherwise.  Total time with patient discussing different treatment options 33 minutes

## 2023-04-03 ENCOUNTER — Encounter: Payer: Self-pay | Admitting: Family Medicine

## 2023-04-04 ENCOUNTER — Other Ambulatory Visit: Payer: Self-pay

## 2023-04-04 MED ORDER — GABAPENTIN 100 MG PO CAPS: 200.0000 mg | ORAL_CAPSULE | Freq: Every day | ORAL | 0 refills | Status: DC

## 2023-04-04 NOTE — Telephone Encounter (Signed)
Rx filled. Patient notified.  

## 2023-04-09 LAB — ANGIOTENSIN CONVERTING ENZYME: Angiotensin-Converting Enzyme: 49 U/L (ref 9–67)

## 2023-04-09 LAB — PTH, INTACT AND CALCIUM
Calcium: 9.9 mg/dL (ref 8.6–10.2)
PTH: 32 pg/mL (ref 16–77)

## 2023-04-10 ENCOUNTER — Other Ambulatory Visit: Payer: Self-pay

## 2023-04-10 ENCOUNTER — Ambulatory Visit (INDEPENDENT_AMBULATORY_CARE_PROVIDER_SITE_OTHER): Payer: Self-pay

## 2023-04-10 DIAGNOSIS — R102 Pelvic and perineal pain: Secondary | ICD-10-CM | POA: Diagnosis not present

## 2023-04-12 NOTE — Addendum Note (Signed)
Addended by: Evon Slack on: 04/12/2023 09:56 AM   Modules accepted: Orders

## 2023-04-13 NOTE — Telephone Encounter (Signed)
Patient called asking if we could go ahead and order it as with contrast.  I told her that it was changed to with and without (I feel like there is always an issue of just with?). Please advise.

## 2023-04-24 ENCOUNTER — Encounter: Payer: Self-pay | Admitting: Family Medicine

## 2023-04-24 MED ORDER — TIZANIDINE HCL 4 MG PO TABS: 4.0000 mg | ORAL_TABLET | Freq: Every evening | ORAL | 2 refills | Status: DC

## 2023-05-11 ENCOUNTER — Encounter: Payer: Self-pay | Admitting: Family Medicine

## 2023-05-15 ENCOUNTER — Encounter: Payer: Self-pay | Admitting: Family Medicine

## 2023-05-15 ENCOUNTER — Ambulatory Visit
Admission: RE | Admit: 2023-05-15 | Discharge: 2023-05-15 | Disposition: A | Discharge status: Home / Self Care | Payer: PRIVATE HEALTH INSURANCE | Source: Ambulatory Visit | Attending: Family Medicine | Admitting: Family Medicine

## 2023-05-15 DIAGNOSIS — R102 Pelvic and perineal pain: Secondary | ICD-10-CM

## 2023-05-15 MED ORDER — GADOPICLENOL 0.5 MMOL/ML IV SOLN
4.0000 mL | Freq: Once | INTRAVENOUS | Status: AC | PRN
  Administered 2023-05-15: 4 mL via INTRAVENOUS

## 2023-05-20 ENCOUNTER — Encounter: Payer: Self-pay | Admitting: Family Medicine

## 2023-05-29 ENCOUNTER — Encounter: Payer: Self-pay | Admitting: Radiology

## 2023-05-30 ENCOUNTER — Ambulatory Visit: Payer: PRIVATE HEALTH INSURANCE | Admitting: Neurology

## 2023-05-30 ENCOUNTER — Ambulatory Visit: Payer: PRIVATE HEALTH INSURANCE | Admitting: Radiology

## 2023-05-31 ENCOUNTER — Ambulatory Visit: Payer: PRIVATE HEALTH INSURANCE | Admitting: Obstetrics and Gynecology

## 2023-06-05 ENCOUNTER — Encounter: Payer: Self-pay | Admitting: Neurology

## 2023-06-07 ENCOUNTER — Ambulatory Visit: Payer: PRIVATE HEALTH INSURANCE | Admitting: Obstetrics and Gynecology

## 2023-06-08 ENCOUNTER — Encounter: Payer: Self-pay | Admitting: Obstetrics and Gynecology

## 2023-06-08 ENCOUNTER — Ambulatory Visit: Payer: PRIVATE HEALTH INSURANCE | Admitting: Obstetrics and Gynecology

## 2023-06-08 VITALS — BP 124/76 | HR 92 | Wt 92.0 lb

## 2023-06-08 DIAGNOSIS — N938 Other specified abnormal uterine and vaginal bleeding: Secondary | ICD-10-CM

## 2023-06-08 NOTE — Progress Notes (Signed)
Patient presents with left shooting pain down her buttox and radiates down her thigh. She did have an irregular period 1/2 months ago and had a period for seven days then stopped for a week off and then bled for 4 weeks after that.  She is usually regular She denies any pelvic pain or pressure She has had ovarian cysts in the past She was sent from provider after MRI showing pelvic congestion syndrome to rule out as a cause of the pain. She is now seeing someone at [facility] and doing some stretching which is helping somewhat.  Blood pressure 124/76, pulse 92, weight 92 lb (41.7 kg), last menstrual period 04/30/2023, SpO2 98%.  A/p sciatica pain  Explained s/s of Pelvic congestion syndrome with worsening pelvic pain/pressure as the day progresses and worse with standing for long periods. Discussed this would not cause sciatica pain. Offered referral to IVR for consult and discussed treatment is with PVC beads. 2.  Discussed DUB treatment options and she would like to wait for now ie IUD, continuous ocp's, dep RTC with any concerns and with annual exams.  15 minutes spent on reviewing records, imaging,  and one on one patient time and counseling patient and documentation Dr. Karma Greaser

## 2023-06-17 ENCOUNTER — Other Ambulatory Visit: Payer: Self-pay | Admitting: Family Medicine

## 2023-06-21 ENCOUNTER — Ambulatory Visit (INDEPENDENT_AMBULATORY_CARE_PROVIDER_SITE_OTHER): Payer: Self-pay | Admitting: Neurology

## 2023-06-21 ENCOUNTER — Ambulatory Visit: Payer: PRIVATE HEALTH INSURANCE | Admitting: Neurology

## 2023-06-21 ENCOUNTER — Encounter: Payer: Self-pay | Admitting: Neurology

## 2023-06-21 DIAGNOSIS — M79604 Pain in right leg: Secondary | ICD-10-CM

## 2023-06-21 DIAGNOSIS — M79605 Pain in left leg: Secondary | ICD-10-CM

## 2023-06-21 NOTE — Progress Notes (Signed)
Full Name: Terry Paul Gender: Female MRN #: [id number] Date of Birth: 04/19/1991    Visit Date: 06/21/2023 09:11 Age: 32 Years Examining Physician: Dr. Naomie Dean Referring Physician: Dr. Despina Arias Height: 5 feet 0 inch    History: Terry Paul is a 32 y.o. female with left leg and foot numbness, left-sided sciatica. Right leg is starting to have similar issues but not as significant as the left leg,  Summary: EMG/Nerve Conduction Studies were performed on the bilateral lower extremities. All nerves and muscles (as indicated in the following tables) were within normal limits.    Conclusion: This is a normal study of the bilateral lower extremities.  No evidence for mononeuropathy, polyneuropathy, lumbar radiculopathy or muscle disease.   ------------------------------- Naomie Dean, M.D.  [facility] [address] [place], [place] [address] Tel: [phone] Fax: [phone]  Verbal informed consent was obtained from the patient, patient was informed of potential risk of procedure, including bruising, bleeding, hematoma formation, infection, muscle weakness, muscle pain, numbness, among others.        MNC    Nerve / Sites Muscle Latency Ref. Amplitude Ref. Rel Amp Segments Distance Velocity Ref. Area    ms ms mV mV %  cm m/s m/s mVms  L Peroneal - EDB     Ankle EDB 5.1 <=6.5 5.5 >=2.0 100 Ankle - EDB 9   17.2     Fib head EDB 9.5  5.5  100 Fib head - Ankle 21 48 >=44 17.2     Pop fossa EDB 11.5  5.3  96.8 Pop fossa - Fib head 11 56 >=44 17.2         Pop fossa - Ankle      R Peroneal - EDB     Ankle EDB 5.4 <=6.5 5.5 >=2.0 100 Ankle - EDB 9   18.6     Fib head EDB 9.8  5.3  97.2 Fib head - Ankle 22 50 >=44 18.4     Pop fossa EDB 11.6  5.4  101 Pop fossa - Fib head 11 62 >=44 19.4         Pop fossa - Ankle      L Tibial - AH     Ankle AH 4.3 <=5.8 20.7 >=4.0 100 Ankle - AH 9   46.2     Pop fossa AH 10.2  16.0  77.4 Pop fossa -  Ankle 32 54 >=41 41.7  R Tibial - AH     Ankle AH 5.1 <=5.8 20.7 >=4.0 100 Ankle - AH 9   42.9     Pop fossa AH 10.9  14.9  71.9 Pop fossa - Ankle 32 55 >=41 38.7             SNC    Nerve / Sites Rec. Site Peak Lat Ref.  Amp Ref. Segments Distance    ms ms V V  cm  L Sural - Ankle (Calf)     Calf Ankle 3.7 <=4.4 21 >=6 Calf - Ankle 14  R Sural - Ankle (Calf)     Calf Ankle 4.0 <=4.4 29 >=6 Calf - Ankle 14  L Superficial peroneal - Ankle     Lat leg Ankle 2.7 <=4.4 13 >=6 Lat leg - Ankle 14  R Superficial peroneal - Ankle     Lat leg Ankle 3.9 <=4.4 15 >=6 Lat leg - Ankle 14  F  Wave    Nerve F Lat Ref.   ms ms  L Tibial - AH 41.8 <=56.0  R Tibial - AH 42.4 <=56.0         H Reflex    Nerve H Lat Lat Hmax   ms ms   Left Right Ref. Left Right Ref.  Tibial - Soleus 31.1 30.2 <=35.0 28.4 27.5 <=35.0         EMG Summary Table    Spontaneous MUAP Recruitment  Muscle IA Fib PSW Fasc Other Amp Dur. Poly Pattern  L. Vastus medialis Normal None None None _______ Normal Normal Normal Normal  L. Tibialis anterior Normal None None None _______ Normal Normal Normal Normal  L. Gastrocnemius (Medial head) Normal None None None _______ Normal Normal Normal Normal  L. Extensor hallucis longus Normal None None None _______ Normal Normal Normal Normal  L. Biceps femoris (long head) Normal None None None _______ Normal Normal Normal Normal  L. Gluteus maximus Normal None None None _______ Normal Normal Normal Normal  L. Gluteus medius Normal None None None _______ Normal Normal Normal Normal  L. Lumbar paraspinals (low) Normal None None None _______ Normal Normal Normal Normal  R. Gastrocnemius (medial Head) Normal None None None _______ Normal Normal Normal Normal  R. Tibialis Anterior Normal None None None _______ Normal Normal Normal Normal  R. Biceps femoris (long head) Normal None None None _______ Normal Normal Normal Normal  R. Lumbar paraspinals (low) Normal None None  None _______ Normal Normal Normal Normal

## 2023-06-26 ENCOUNTER — Encounter: Payer: Self-pay | Admitting: Obstetrics and Gynecology

## 2023-06-26 NOTE — Telephone Encounter (Signed)
Reviewed 06/08/23 OV notes. Patient was seen for DUB, PUS ordered.   Dr. Karma Greaser -please advise on PUS. See patient MyChart message.

## 2023-08-14 ENCOUNTER — Encounter: Payer: Self-pay | Admitting: Sports Medicine

## 2023-08-23 ENCOUNTER — Ambulatory Visit: Payer: Self-pay | Admitting: Orthopedic Surgery

## 2023-08-24 ENCOUNTER — Encounter: Payer: Self-pay | Admitting: Sports Medicine

## 2023-08-24 ENCOUNTER — Ambulatory Visit (INDEPENDENT_AMBULATORY_CARE_PROVIDER_SITE_OTHER): Payer: PRIVATE HEALTH INSURANCE | Admitting: Sports Medicine

## 2023-08-24 DIAGNOSIS — G5782 Other specified mononeuropathies of left lower limb: Secondary | ICD-10-CM | POA: Diagnosis not present

## 2023-08-24 DIAGNOSIS — M5442 Lumbago with sciatica, left side: Secondary | ICD-10-CM

## 2023-08-24 DIAGNOSIS — G5702 Lesion of sciatic nerve, left lower limb: Secondary | ICD-10-CM

## 2023-08-24 DIAGNOSIS — G8929 Other chronic pain: Secondary | ICD-10-CM

## 2023-08-24 NOTE — Progress Notes (Signed)
Patient says that pain and numbness in down the leg and into the foot has been more consistent over the last year or so, but she did notice it prior to that. She says that she feels it when she sits, especially for longer periods of time. She says that she also notices it some when she lays on her back. Patient says that standing and being active does not seem to be painful unless she has just been sitting for awhile.

## 2023-08-24 NOTE — Progress Notes (Signed)
Terry Paul - 32 y.o. female MRN [id number]  Date of birth: 11-16-1990  Office Visit Note: Visit Date: 08/24/2023 PCP: Park Meo, FNP Referred by: Madelyn Brunner, DO  Subjective: Chief Complaint  Patient presents with   Left Leg - Pain   HPI: Terry Paul is a very pleasant 32 y.o. female who presents today for chronic LLE pain as well as numbness and tingling radiating down from the left low back/buttock down the posterior leg all the way into the foot.  She has had the symptoms for about 1 year now. She feels with pain more so when she sits, especially for longer periods of time. She also has reproduction of symptoms when she lies. Her symptoms do seem to be better when she is active and walking.  Denies any pressure in the buttock or pelvic region, more burning/numbness and tingling.  She is managed on gabapentin 300 mg twice daily as well as Zanaflex 2 mg nightly.  These slightly helped her symptoms and care of the sharp pain but is certainly not relieve this.  She does have manipulation perform once monthly and this is slightly helping her symptoms but she is still searching for an answer for her pain and radicular symptoms.  She has seen a few separate providers for this and has had an MRI of the cervical, thoracic and lumbar spine as well as an MRI of the pelvis which have not produced any specific answers for her diagnosis.  Pertinent ROS were reviewed with the patient and found to be negative unless otherwise specified above in HPI.   Assessment & Plan: Visit Diagnoses:  1. Sciatic neuropathy, left   2. Chronic left-sided low back pain with left-sided sciatica   3. Nerve entrapment of lower limb, left    Plan: I had a good discussion with Terry Paul today regarding her chronic burning and tingling sensation down the left buttock and leg.  She has had extensive workup including multiple MRIs which have not been diagnostic yield.  I did review those today and specifically her lumbar  MRI which shows no evidence of neural impingement or disc abnormality.  Review of her pelvic MRI does show what appears to be a degree of swollen fascicles of the left sciatic nerve around the area of the piriformis but there is no abnormality of the piriformis musculature itself.  I believe this would be better evaluated with dynamic ultrasound scanning of the sciatic nerve and the ischiofemoral space. She has very suggestive symptoms of sciatic nerve entrapment/neuropathy - we discussed trialing an ultrasound-guided sciatic nerve hydrodissection both for diagnostic and hopefully therapeutic purposes. Her ultrasound scan will dictate location, but may consider this both proximal and distal to the piriformis musculature.  Could consider an ischiofemoral injection as well if the above first do not improve her pain.  In the meantime, she will continue her gabapentin 300 mg twice daily.  May use her Zanaflex as needed.  She will follow-up with me next week for evaluation and diagnostic scan/intervention.  Follow-up: Next week  Meds & Orders: No orders of the defined types were placed in this encounter.  No orders of the defined types were placed in this encounter.    Procedures: No procedures performed      Clinical History: No specialty comments available.  She reports that she has never smoked. She has never used smokeless tobacco.  Recent Labs    04/02/23 1308  LABURIC 4.1    Objective:    Physical  Exam  Gen: Well-appearing, in no acute distress; non-toxic CV: Well-perfused. Warm.  Resp: Breathing unlabored on room air; no wheezing. Psych: Fluid speech in conversation; appropriate affect; normal thought process Neuro: Sensation intact throughout. No gross coordination deficits.   Ortho Exam - Lumbar/Buttock: No midline spinous process TTP.  There is full range of motion with flexion and extension.  Negative straight leg raise bilaterally.  Mild discomfort with deep palpation and  Tinel's testing in the region of the mid buttock and proximal sciatic.  Negative Stride, negative Finnoff test.  Nonantalgic gait.  5/5 strength of bilateral lower extremities.  Imaging:  *Independent review and interpretation of the MRI of the lumbar spine from 10/24/2022 was performed by myself today.  There are 5 nonrib-bearing lumbar vertebra with preserved intervertebral disc spaces.  There are no disc bulge, herniations or evidence of canal stenosis or foraminal narrowing.  MR PELVIS W WO CONTRAST CLINICAL DATA:  Left buttock and leg pain for 8 months, no known injury  EXAM: MRI PELVIS WITHOUT AND WITH CONTRAST  TECHNIQUE: Multiplanar multisequence MR imaging of the pelvis was performed both before and after administration of intravenous contrast.  CONTRAST:  3.5 mL Vueway gadolinium contrast IV  COMPARISON:  None Available.  FINDINGS: Urinary Tract:  No abnormality visualized.  Bowel:  Unremarkable visualized pelvic bowel loops.  Vascular/Lymphatic: No pathologically enlarged lymph nodes. No significant vascular abnormality seen.  Reproductive: No mass. C-section scar of the lower anterior uterine segment. Numerous bilateral ovarian follicles, benign, requiring no further follow-up or characterization. Prominent bilateral ovarian and adnexal varices (series 9, image 17).  Other:  Small volume free fluid in the low pelvis.  Musculoskeletal: No suspicious bone lesions identified. Mild pubic symphysis arthrosis (series 6, image 28).  IMPRESSION: 1. No acute MR findings of the pelvis to explain pain. 2. Prominent bilateral ovarian and adnexal varices, which can be seen in the setting of pelvic congestion if referable signs and symptoms are present. 3. Small volume free fluid in the low pelvis, likely functional in the reproductive age setting. 4. Mild pubic symphysis arthrosis.  Electronically Signed   By: Jearld Lesch M.D.   On: 05/15/2023 11:01  Narrative &  Impression    [facility] [address], [address] [place], [place] [address] [phone]   NEUROIMAGING REPORT     STUDY DATE: 01/21/2023 PATIENT NAME: Terry Paul DOB: 09-Apr-1991 MRN: [id number]   EXAM: MRI of the cervical spine   ORDERING CLINICIAN: Richard A. Epimenio Foot, MD. PhD CLINICAL HISTORY: 32 year old woman with left leg numbness.  Assess for demyelination COMPARISON FILMS: None   TECHNIQUE: MRI of the cervical spine was obtained utilizing 3 mm sagittal slices from the posterior fossa down to the T3-4 level with T1, T2 and inversion recovery views. In addition 4 mm axial slices from C2-3 down to T1-2 level were included with T2 and gradient echo views. CONTRAST: None IMAGING SITE: [facility], [address] [place], [place], [place]   FINDINGS: :  On sagittal images, the spine is imaged from above the cervicomedullary junction to T2.   The visible brain and the cervicomedullary junction appears normal.  Paravertebral soft tissue appears normal.  The spinal cord is of normal caliber and signal.   The vertebral bodies are normally aligned.   The vertebral bodies have normal signal.     The discs and interspaces were further evaluated on axial views from C2 to T1.  At C5-C6 there is a small central disc bulge.  There is no  spinal stenosis, foraminal narrowing or nerve root compression       IMPRESSION: This MRI of the cervical spine without contrast shows the following: The spinal cord appears normal. Small disc bulges C5-C6 but no spinal stenosis or nerve root compression.       INTERPRETING PHYSICIAN:  Richard A. Epimenio Foot, MD, PhD, FAAN Certified in  Neuroimaging by [employer]   Narrative & Impression  [facility] [address] [place], [place] [address] [phone]   NEUROIMAGING REPORT     STUDY DATE: 01/21/2023 PATIENT NAME: Terry Paul DOB: 21-Jun-1991 MRN: [id number]   EXAM: MRI of the thoracic  spine   ORDERING CLINICIAN: Richard A. Sater, MD. PhD CLINICAL HISTORY: 32 year old woman with left leg numbness and dysesthesias COMPARISON FILMS: None   TECHNIQUE: MRI of the thoracic spine was obtained utilizing 3 mm sagittal slices from the posterior fossa from C7 to L1 level with T1, T2 and inversion recovery views. In addition 4 mm axial slices from C6C7 to T12L1 level were included with T2 and gradient echo views. CONTRAST: None IMAGING SITE: [facility], [address] [place], [place], [place]   FINDINGS: :  On scout images, the spine is imaged from above the cervicomedullary junction to the lower thoracic spine.  The dedicated thoracic spine images 1 from C6-L2.  The spinal cord has normal caliber and normal signal..   The vertebral bodies are normally aligned.   The vertebral bodies have normal signal.  The discs and interspaces were further evaluated on axial views from C7 to L1.  The discs appear normal.  No spinal stenosis, foraminal narrowing or nerve root compression.     IMPRESSION: This is a normal MRI of the thoracic spine.       INTERPRETING PHYSICIAN:  Richard A. Epimenio Foot, MD, PhD, FAAN Certified in  Neuroimaging by [employer]   Past Medical/Family/Surgical/Social History: Medications & Allergies reviewed per EMR, new medications updated. Patient Active Problem List   Diagnosis Date Noted   Somatic dysfunction of spine, sacral 02/05/2023   Left sided sciatica 11/20/2022   Irregular menses 06/12/2019   Underweight 06/12/2019   Family history of thyroid disease in mother 06/12/2019   Environmental and seasonal allergies 06/12/2019   Hypertension in pregnancy, postpartum condition 09/22/2013   Disorder of skin and subcutaneous tissue 07/10/2013   Status post primary low transverse cesarean section 05/01/2013    Class: Status post   Twin pregnancy delivered 05/01/2013    Class: Status post   Twin pregnancy, twins discordant in third trimester  05/01/2013    Class: Present on Admission   Monochorionic diamniotic twin pregnancy 02/13/2013   Past Medical History:  Diagnosis Date   Preeclampsia    UTI (lower urinary tract infection)    Family History  Problem Relation Age of Onset   Healthy Mother    Healthy Father    Past Surgical History:  Procedure Laterality Date   CESAREAN SECTION N/A 04/26/2013   Procedure: Primary cesarean section  with delivery of twin  A girl at 65. Twin B girl at 32.;  Surgeon: Zelphia Cairo, MD;  Location: [facility];  Service: Obstetrics;  Laterality: N/A;   Social History   Occupational History   Not on file  Tobacco Use   Smoking status: Never   Smokeless tobacco: Never  Vaping Use   Vaping status: Never Used  Substance and Sexual Activity   Alcohol use: Never   Drug use: No   Sexual activity:  Yes    Partners: Male    Birth control/protection: Other-see comments    Comment: VAS

## 2023-08-27 ENCOUNTER — Encounter: Payer: Self-pay | Admitting: Sports Medicine

## 2023-08-27 ENCOUNTER — Ambulatory Visit (INDEPENDENT_AMBULATORY_CARE_PROVIDER_SITE_OTHER): Payer: PRIVATE HEALTH INSURANCE | Admitting: Sports Medicine

## 2023-08-27 ENCOUNTER — Other Ambulatory Visit: Payer: Self-pay

## 2023-08-27 DIAGNOSIS — M5442 Lumbago with sciatica, left side: Secondary | ICD-10-CM | POA: Diagnosis not present

## 2023-08-27 DIAGNOSIS — G5702 Lesion of sciatic nerve, left lower limb: Secondary | ICD-10-CM

## 2023-08-27 DIAGNOSIS — G5782 Other specified mononeuropathies of left lower limb: Secondary | ICD-10-CM

## 2023-08-27 DIAGNOSIS — G8929 Other chronic pain: Secondary | ICD-10-CM

## 2023-08-27 NOTE — Progress Notes (Signed)
Terry Paul - 32 y.o. female MRN [id number]  Date of birth: 05-06-1991  Office Visit Note: Visit Date: 08/27/2023 PCP: Park Meo, FNP Referred by: Park Meo, FNP  Subjective: Chief Complaint  Patient presents with   Left Leg - Pain   HPI: Terry Paul is a pleasant 32 y.o. female who presents today for chronic left lower extremity pain as well as numbness and tingling that radiates down from the left buttock/low back down the posterior leg all the way into the foot.  She continues on gabapentin 300 mg twice daily as well as Zanaflex 2 mg nightly as needed.  She has had extensive workup including being seen a few separate providers for this and has had an MRI of the cervical, thoracic and lumbar spine as well as an MRI of the pelvis which have not produced any specific answers for her diagnosis.  I did review her lumbar and pelvic MRI at last visit, her lumbar MRI looks pristine.  She did appear to have some mildly swollen sciatic nerve near the level of the piriformis and just distal on my review.  We discussed trialing hydrodissection injection around the sciatic nerve today, she is agreeable.  Pertinent ROS were reviewed with the patient and found to be negative unless otherwise specified above in HPI.   Assessment & Plan: Visit Diagnoses:  1. Sciatic neuropathy, left   2. Nerve entrapment of lower limb, left   3. Chronic left-sided low back pain with left-sided sciatica    Plan: Impression is chronic burning and tingling sensation from the left buttock down the posterior left leg all the way to the ankle/foot indicative of likely sciatic nerve entrapment with neuropathy.  She has had extensive workup including multiple MRIs which have not been of diagnostic yield.  Through shared decision making, we did proceed with ultrasound-guided sciatic nerve hydrodissection injection for both diagnostic and hopefully therapeutic purposes. This was performed at the upper level of the  piriformis near the ischiofemoral space.  We will see over the next 2 weeks what sort of benefit she receives from this procedure and she will update me (0-100% of improvement).  If she finds improvement, we can consider repeating this injection up to 2 or 3 times with repeat hydrodissection.  If she does not have significant relief, we would consider performing this at the lower level near the gluteal crease between the obturator internus and hamstrings of the sciatic nerve. We discussed proper postinjection protocol.  She may continue her gabapentin 300 mg twice daily and use her Zanaflex as needed. F/u in 2 weeks.  Follow-up: Return in about 2 weeks (around 09/10/2023) for f/u LLE/sciatic nerve .   Meds & Orders: No orders of the defined types were placed in this encounter.   Orders Placed This Encounter  Procedures   [place] Extrem Low Left Ltd     Procedures: US-guided sciatic nerve hydrodissection (neurolysis), left leg: After discussion on risks/benefits/indications, informed verbal consent was obtained. A timeout was then performed. Patient was placed in prone position on the table in exam room with affected leg facing upward in a neutral position. The overlying area was prepped with ChloraPrep and multiple alcohol swabs. Utilizing ultrasound guidance, the sciatic nerve and surrounding neurovasculature was identified. The overlying soft tissue was first anesthetized with 4 cc of lidocaine 1%.  Following this, using ultrasound guidance via an in-plane approach, a 22-gauge, 3.5 inch needle was inserted from a  lateral to medial direction around the nerve.  Using a mixture of 4:4:40:2 cc's of lidocaine:bupivicaine:D5W:methylprednisolone, the nerve was hydrodissected. The needle was inserted both above and below the nerve with ultrasound visualization of 360 degrees spread and appropriate hydrodissection and lysis of the nerve. Patient tolerated the procedure well without immediate complication.  Band-Aid  was applied.           Clinical History: No specialty comments available.  She reports that she has never smoked. She has never used smokeless tobacco.  Recent Labs    04/02/23 1308  LABURIC 4.1    Objective:    Physical Exam  Gen: Well-appearing, in no acute distress; non-toxic CV: Regular Rate. Well-perfused. Warm.  Resp: Breathing unlabored on room air; no wheezing. Psych: Fluid speech in conversation; appropriate affect; normal thought process Neuro: Sensation intact throughout. No gross coordination deficits.   Ortho Exam - Lumbar/Buttock: No redness swelling or effusion.  There is very mild discomfort with deep palpation in between the ischial tuberosity and greater trochanter.  There is no dynamic muscle abnormality of the piriformis with internal or external rotation.  5/5 strength of bilateral lower extremities.  Imaging: [place] Extrem Low Left Ltd  Result Date: 08/27/2023 Limited musculoskeletal ultrasound of the left posterior hip and buttock region was performed today.  There is no cortical irregularity of the ischial tuberosity or the greater trochanter.  Evaluation of the gluteus maximus and piriformis there is no tearing or muscular abnormality.  The sciatic nerve was evaluated in the ischiofemoral space after treatments distal to the piriformis and superficial to the quadratus.  At the level near the piriformis and just distal to the base at the gluteal fold there are some swollen fascicles in this area possibly indicative of entrapment.  The surrounding neurovasculature was seen medial to the sciatic nerve. *Technically successful ultrasound-guided sciatic nerve hydrodissection injection     Past Medical/Family/Surgical/Social History: Medications & Allergies reviewed per EMR, new medications updated. Patient Active Problem List   Diagnosis Date Noted   Somatic dysfunction of spine, sacral 02/05/2023   Left sided sciatica 11/20/2022   Irregular menses 06/12/2019    Underweight 06/12/2019   Family history of thyroid disease in mother 06/12/2019   Environmental and seasonal allergies 06/12/2019   Hypertension in pregnancy, postpartum condition 09/22/2013   Disorder of skin and subcutaneous tissue 07/10/2013   Status post primary low transverse cesarean section 05/01/2013    Class: Status post   Twin pregnancy delivered 05/01/2013    Class: Status post   Twin pregnancy, twins discordant in third trimester 05/01/2013    Class: Present on Admission   Monochorionic diamniotic twin pregnancy 02/13/2013   Past Medical History:  Diagnosis Date   Preeclampsia    UTI (lower urinary tract infection)    Family History  Problem Relation Age of Onset   Healthy Mother    Healthy Father    Past Surgical History:  Procedure Laterality Date   CESAREAN SECTION N/A 04/26/2013   Procedure: Primary cesarean section  with delivery of twin  A girl at 54. Twin B girl at 68.;  Surgeon: Zelphia Cairo, MD;  Location: [facility];  Service: Obstetrics;  Laterality: N/A;   Social History   Occupational History   Not on file  Tobacco Use   Smoking status: Never   Smokeless tobacco: Never  Vaping Use   Vaping status: Never Used  Substance and Sexual Activity   Alcohol use: Never   Drug use: No   Sexual activity: Yes    Partners: Male  Birth control/protection: Other-see comments    Comment: VAS

## 2023-09-03 ENCOUNTER — Ambulatory Visit: Payer: Self-pay | Admitting: Orthopaedic Surgery

## 2023-09-05 ENCOUNTER — Ambulatory Visit: Payer: Self-pay | Admitting: Orthopaedic Surgery

## 2023-09-05 ENCOUNTER — Ambulatory Visit: Payer: PRIVATE HEALTH INSURANCE | Admitting: Physical Therapy

## 2023-09-05 NOTE — Therapy (Deleted)
OUTPATIENT PHYSICAL THERAPY THORACOLUMBAR EVALUATION   Patient Name: Terry Paul MRN: [id number] DOB:09/11/1991, 32 y.o., female Today's Date: 09/05/2023  END OF SESSION:   Past Medical History:  Diagnosis Date   Preeclampsia    UTI (lower urinary tract infection)    Past Surgical History:  Procedure Laterality Date   CESAREAN SECTION N/A 04/26/2013   Procedure: Primary cesarean section  with delivery of twin  A girl at 55. Twin B girl at 13.;  Surgeon: Zelphia Cairo, MD;  Location: [facility];  Service: Obstetrics;  Laterality: N/A;   Patient Active Problem List   Diagnosis Date Noted   Somatic dysfunction of spine, sacral 02/05/2023   Left sided sciatica 11/20/2022   Irregular menses 06/12/2019   Underweight 06/12/2019   Family history of thyroid disease in mother 06/12/2019   Environmental and seasonal allergies 06/12/2019   Hypertension in pregnancy, postpartum condition 09/22/2013   Disorder of skin and subcutaneous tissue 07/10/2013   Status post primary low transverse cesarean section 05/01/2013    Class: Status post   Twin pregnancy delivered 05/01/2013    Class: Status post   Twin pregnancy, twins discordant in third trimester 05/01/2013    Class: Present on Admission   Monochorionic diamniotic twin pregnancy 02/13/2013    PCP: Park Meo FNP   REFERRING PROVIDER: Teryl Lucy MD and Madelyn Brunner, DO   REFERRING DIAG: sciatic neuropathy  Rationale for Evaluation and Treatment: Rehabilitation  THERAPY DIAG:  No diagnosis found.  ONSET DATE: acute   SUBJECTIVE:                                                                                                                                                                                           SUBJECTIVE STATEMENT: Patient reports for PT evaluation to address L posterior hip and leg pain due to entrapped L sciatic nerve.  She had an injection (hydrodissection) and it helped her pain   PERTINENT  HISTORY:  impression is chronic burning and tingling sensation from the left buttock down the posterior left leg all the way to the ankle/foot indicative of likely sciatic nerve entrapment with neuropathy.  She has had extensive workup including multiple MRIs which have not been of diagnostic yield.  Through shared decision making, we did proceed with ultrasound-guided sciatic nerve hydrodissection injection for both diagnostic and hopefully therapeutic purposes. This was performed at the upper level of the piriformis near the ischiofemoral space.  We will see over the next 2 weeks what sort of benefit she receives from this procedure and she will update me (0-100% of improvement).  If she finds improvement, we can consider  repeating this injection up to 2 or 3 times with repeat hydrodissection.  If she does not have significant relief, we would consider performing this at the lower level near the gluteal crease between the obturator internus and hamstrings of the sciatic nerve. We discussed proper postinjection protocol.  She may continue her gabapentin 300 mg twice daily and use her Zanaflex as needed. F/u in 2 weeks.  Terry Paul is a pleasant 32 y.o. female who presents today for chronic left lower extremity pain as well as numbness and tingling that radiates down from the left buttock/low back down the posterior leg all the way into the foot.   She continues on gabapentin 300 mg twice daily as well as Zanaflex 2 mg nightly as needed.  She has had extensive workup including being seen a few separate providers for this and has had an MRI of the cervical, thoracic and lumbar spine as well as an MRI of the pelvis which have not produced any specific answers for her diagnosis.  I did review her lumbar and pelvic MRI at last visit, her lumbar MRI looks pristine.  She did appear to have some mildly swollen sciatic nerve near the level of the piriformis and just distal on my review.  We discussed trialing  hydrodissection injection around the sciatic nerve today, she is agreeable.    PAIN:  Are you having pain? Yes: NPRS scale: ***/10 Pain location: *** Pain description: *** Aggravating factors: *** Relieving factors: ***  PRECAUTIONS: {Therapy precautions:24002}  RED FLAGS: {PT Red Flags:29287}   WEIGHT BEARING RESTRICTIONS: No  FALLS:  Has patient fallen in last 6 months? {fallsyesno:27318}  LIVING ENVIRONMENT: Lives with: {OPRC lives with:25569::"lives with their family"} Lives in: {Lives in:25570} Stairs: {opstairs:27293} Has following equipment at home: {Assistive devices:23999}  OCCUPATION: ***  PLOF: {PLOF:24004}  PATIENT GOALS: ***  NEXT MD VISIT: ***  OBJECTIVE:  Note: Objective measures were completed at Evaluation unless otherwise noted.  DIAGNOSTIC FINDINGS:  Limited musculoskeletal ultrasound of the left posterior hip and buttock region was performed today. There is no cortical irregularity of the ischial tuberosity or the greater trochanter. Evaluation of the gluteus maximus and piriformis there is no tearing or muscular abnormality. The sciatic nerve was evaluated in the ischiofemoral space after treatments distal to the piriformis and superficial to the quadratus. At the level near the piriformis and just distal to the base at the gluteal fold there are some swollen fascicles in this area possibly indicative of entrapment. The surrounding neurovasculature was seen medial to the sciatic nerve.  Lumbar MRI "pristine" PATIENT SURVEYS:  FOTO ***  COGNITION: Overall cognitive status: Within functional limits for tasks assessed     SENSATION: {sensation:27233}  MUSCLE LENGTH: Hamstrings: Right *** deg; Left *** deg Maisie Fus test: Right *** deg; Left *** deg  POSTURE: {posture:25561}  PALPATION: ***  LUMBAR ROM:   AROM eval  Flexion   Extension   Right lateral flexion   Left lateral flexion   Right rotation   Left rotation    (Blank rows = not  tested)  LOWER EXTREMITY ROM:     {AROM/PROM:27142}  Right eval Left eval  Hip flexion    Hip extension    Hip abduction    Hip adduction    Hip internal rotation    Hip external rotation    Knee flexion    Knee extension    Ankle dorsiflexion    Ankle plantarflexion    Ankle inversion    Ankle eversion     (  Blank rows = not tested)  LOWER EXTREMITY MMT:    MMT Right eval Left eval  Hip flexion    Hip extension    Hip abduction    Hip adduction    Hip internal rotation    Hip external rotation    Knee flexion    Knee extension    Ankle dorsiflexion    Ankle plantarflexion    Ankle inversion    Ankle eversion     (Blank rows = not tested)  LUMBAR SPECIAL TESTS:  {lumbar special test:25242}  FUNCTIONAL TESTS:  {Functional tests:24029}  GAIT: Distance walked: *** Assistive device utilized: {Assistive devices:23999} Level of assistance: {Levels of assistance:24026} Comments: ***  TODAY'S TREATMENT:                                                                                                                              DATE: ***    PATIENT EDUCATION:  Education details: *** Person educated: {Person educated:25204} Education method: {Education Method:25205} Education comprehension: {Education Comprehension:25206}  HOME EXERCISE PROGRAM: ***  ASSESSMENT:  CLINICAL IMPRESSION: Patient is a *** y.o. *** who was seen today for physical therapy evaluation and treatment for ***.   OBJECTIVE IMPAIRMENTS: {opptimpairments:25111}.   ACTIVITY LIMITATIONS: {activitylimitations:27494}  PARTICIPATION LIMITATIONS: {participationrestrictions:25113}  PERSONAL FACTORS: {Personal factors:25162} are also affecting patient's functional outcome.   REHAB POTENTIAL: {rehabpotential:25112}  CLINICAL DECISION MAKING: {clinical decision making:25114}  EVALUATION COMPLEXITY: {Evaluation complexity:25115}   GOALS: Goals reviewed with patient?  {yes/no:20286}  SHORT TERM GOALS: Target date: ***  *** Baseline: Goal status: INITIAL  2.  *** Baseline:  Goal status: INITIAL  3.  *** Baseline:  Goal status: INITIAL  4.  *** Baseline:  Goal status: INITIAL  5.  *** Baseline:  Goal status: INITIAL  6.  *** Baseline:  Goal status: INITIAL  LONG TERM GOALS: Target date: ***  *** Baseline:  Goal status: INITIAL  2.  *** Baseline:  Goal status: INITIAL  3.  *** Baseline:  Goal status: INITIAL  4.  *** Baseline:  Goal status: INITIAL  5.  *** Baseline:  Goal status: INITIAL  6.  *** Baseline:  Goal status: INITIAL  PLAN:  PT FREQUENCY: {rehab frequency:25116}  PT DURATION: {rehab duration:25117}  PLANNED INTERVENTIONS: {rehab planned interventions:25118::"97110-Therapeutic exercises","97530- Therapeutic [id number]- Neuromuscular re-education","97535- Self JXBJ","47829- Manual therapy"}.  PLAN FOR NEXT SESSION: ***   Gerarda Conklin, PT 09/05/2023, 9:19 AM

## 2023-09-06 ENCOUNTER — Ambulatory Visit: Payer: Self-pay | Admitting: Sports Medicine

## 2023-09-07 ENCOUNTER — Other Ambulatory Visit: Payer: Self-pay

## 2023-09-07 ENCOUNTER — Ambulatory Visit (INDEPENDENT_AMBULATORY_CARE_PROVIDER_SITE_OTHER): Payer: PRIVATE HEALTH INSURANCE | Admitting: Sports Medicine

## 2023-09-07 ENCOUNTER — Encounter: Payer: Self-pay | Admitting: Sports Medicine

## 2023-09-07 DIAGNOSIS — G5702 Lesion of sciatic nerve, left lower limb: Secondary | ICD-10-CM

## 2023-09-07 DIAGNOSIS — M7918 Myalgia, other site: Secondary | ICD-10-CM | POA: Diagnosis not present

## 2023-09-07 DIAGNOSIS — M5442 Lumbago with sciatica, left side: Secondary | ICD-10-CM | POA: Diagnosis not present

## 2023-09-07 DIAGNOSIS — G5782 Other specified mononeuropathies of left lower limb: Secondary | ICD-10-CM

## 2023-09-07 DIAGNOSIS — G8929 Other chronic pain: Secondary | ICD-10-CM

## 2023-09-07 MED ORDER — PREGABALIN 75 MG PO CAPS
75.0000 mg | ORAL_CAPSULE | Freq: Every day | ORAL | 0 refills | Status: DC
Start: 2023-09-07 — End: 2023-10-02

## 2023-09-07 NOTE — Progress Notes (Signed)
Patient says that she has not noticed any improvements yet. She says that everything feels about the same, although she is now having some tingling at the top of the left foot, mostly in the midfoot but she can feel it a bit in the toes.

## 2023-09-07 NOTE — Progress Notes (Addendum)
Terry Paul - 32 y.o. female MRN [id number]  Date of birth: [birth year]  Office Visit Note: Visit Date: 09/07/2023 PCP: Park Meo, FNP Referred by: Park Meo, FNP  Subjective: Chief Complaint  Patient presents with   Left Leg - Pain, Follow-up   HPI: Terry Paul is a pleasant 32 y.o. female who presents today for chronic left lower extremity pain as well as numbness and tingling that radiates down from the left buttock/low back down the posterior leg all the way into the foot.  Did perform US-guided sciatic nerve hydrodissection (superior to piriformis) on 08/27/23 -she did have some less pain associated with sitting for that today, but in general her symptoms are about the same.  More of her numbness and tingling is from the buttock down the posterior leg but she does occasionally get some of this down the lower leg and into the foot as well.  She is taking gabapentin 300 mg twice daily which she does feel helps to some extent but not significantly.   Pertinent ROS were reviewed with the patient and found to be negative unless otherwise specified above in HPI.   Assessment & Plan: Visit Diagnoses:  1. Sciatic neuropathy, left   2. Nerve entrapment of lower limb, left   3. Chronic left-sided low back pain with left-sided sciatica   4. Bilateral buttock pain    Plan: Impression is chronic numbness and tingling sensation from the left buttock down the posterior leg down to the ankle and foot which seems likely due to sciatic nerve entrapment with associated neuropathy.  She did not get much relief unfortunately from the sciatic nerve hydrodissection superior to the piriformis musculature.  We did identify the nerve inferior to this location today on ultrasound and did perform a diagnostic and therapeutic sciatic nerve hydrodissection under ultrasound guidance today.  She will let me know over the next 1 week what sort of improvement/progress she has made from this procedure.  We  discussed postinjection protocol.  We also discussed changing pharmacologic management since her gabapentin is not as helpful for her.  I will send in Lyrica to start 75 mg once daily (begin in 1 week), we do have room to increase this to twice daily dosing or could consider nighttime Elavil as well but will hold for this now given anti-cholinergic effects. I did recommend that she discontinue her Zanaflex. She will notify me of update/progress x 1 week.   Follow-up: Return for message/update me on progress/improvement x 1 week.   Meds & Orders:  Meds ordered this encounter  Medications   pregabalin (LYRICA) 75 MG capsule    Sig: Take 1 capsule (75 mg total) by mouth daily.    Dispense:  30 capsule    Refill:  0    Orders Placed This Encounter  Procedures   [place] Extrem Low Left Ltd     Procedures: US-guided sciatic nerve hydrodissection (neurolysis), left leg: After discussion on risks/benefits/indications, informed verbal consent was obtained. A timeout was then performed. Patient was placed in prone position on the table in exam room with affected leg facing upward in a neutral position. The overlying area was prepped with ChloraPrep and multiple alcohol swabs. Utilizing ultrasound guidance, the sciatic nerve and surrounding neurovasculature was identified. The overlying soft tissue was first anesthetized with 4 cc of lidocaine 1%.  Following this, using ultrasound guidance via an in-plane approach, a 22-gauge, 3.5 inch needle was inserted from a  lateral to medial direction around the  nerve. Using a mixture of 4:4:30:2 cc's of lidocaine:bupivicaine:D5W:methylprednisolone, the nerve was hydrodissected. The needle was inserted both above and below the nerve with ultrasound visualization of 360 degrees spread and appropriate hydrodissection and lysis of the nerve. Patient tolerated the procedure well without immediate complication.  Band-Aid was applied        Clinical History: No specialty  comments available.  She reports that she has never smoked. She has never used smokeless tobacco.  Recent Labs    04/02/23 1308  LABURIC 4.1    Objective:    Physical Exam  Gen: Well-appearing, in no acute distress; non-toxic CV:  Well-perfused. Warm.  Resp: Breathing unlabored on room air; no wheezing. Psych: Fluid speech in conversation; appropriate affect; normal thought process Neuro: Sensation intact throughout. No gross coordination deficits.   Ortho Exam - Lumbar/Posterior buttock: No redness swelling or effusion.  There is a small ecchymosis likely from prior injection over the mid buttock.  No significant Tinel's testing.  There is very mild discomfort with deep palpation near the ischiofemoral space. 5/5 strength of bilateral LE's.  Imaging: [place] Extrem Low Left Ltd Result Date: 09/07/2023 Limited musculoskeletal ultrasound of the left posterior hip and buttock was performed today.  No cortical irregularity of the ischial tuberosity.  The sciatic nerve was identified both superior and inferior to the piriformis musculature.  The piriformis was seen both static and dynamic views with out tendinopathy.  The sciatic nerve did set just superior to the piriformis.  The left sciatic nerve is slightly larger in diameter compared to the contralateral right side. *Technically successful ultrasound-guided sciatic nerve hydrodissection injection.     Past Medical/Family/Surgical/Social History: Medications & Allergies reviewed per EMR, new medications updated. Patient Active Problem List   Diagnosis Date Noted   Somatic dysfunction of spine, sacral 02/05/2023   Left sided sciatica 11/20/2022   Irregular menses 06/12/2019   Underweight 06/12/2019   Family history of thyroid disease in mother 06/12/2019   Environmental and seasonal allergies 06/12/2019   Hypertension in pregnancy, postpartum condition 09/22/2013   Disorder of skin and subcutaneous tissue 07/10/2013   Status post  primary low transverse cesarean section 05/01/2013    Class: Status post   Twin pregnancy delivered 05/01/2013    Class: Status post   Twin pregnancy, twins discordant in third trimester 05/01/2013    Class: Present on Admission   Monochorionic diamniotic twin pregnancy 02/13/2013   Past Medical History:  Diagnosis Date   Preeclampsia    UTI (lower urinary tract infection)    Family History  Problem Relation Age of Onset   Healthy Mother    Healthy Father    Past Surgical History:  Procedure Laterality Date   CESAREAN SECTION N/A 04/26/2013   Procedure: Primary cesarean section  with delivery of twin  A girl at 14. Twin B girl at 39.;  Surgeon: Zelphia Cairo, MD;  Location: [facility];  Service: Obstetrics;  Laterality: N/A;   Social History   Occupational History   Not on file  Tobacco Use   Smoking status: Never   Smokeless tobacco: Never  Vaping Use   Vaping status: Never Used  Substance and Sexual Activity   Alcohol use: Never   Drug use: No   Sexual activity: Yes    Partners: Male    Birth control/protection: Other-see comments    Comment: VAS

## 2023-09-21 ENCOUNTER — Ambulatory Visit (HOSPITAL_BASED_OUTPATIENT_CLINIC_OR_DEPARTMENT_OTHER): Payer: Self-pay | Admitting: Orthopaedic Surgery

## 2023-09-28 ENCOUNTER — Encounter: Payer: Self-pay | Admitting: Neurology

## 2023-10-02 ENCOUNTER — Other Ambulatory Visit: Payer: Self-pay | Admitting: Sports Medicine

## 2023-10-02 ENCOUNTER — Other Ambulatory Visit: Payer: Self-pay | Admitting: Neurology

## 2023-10-02 DIAGNOSIS — G5782 Other specified mononeuropathies of left lower limb: Secondary | ICD-10-CM

## 2023-10-02 DIAGNOSIS — G5702 Lesion of sciatic nerve, left lower limb: Secondary | ICD-10-CM

## 2023-10-02 DIAGNOSIS — G8929 Other chronic pain: Secondary | ICD-10-CM

## 2023-10-03 ENCOUNTER — Other Ambulatory Visit: Payer: Self-pay | Admitting: Sports Medicine

## 2023-10-03 MED ORDER — PREGABALIN 75 MG PO CAPS: 75.0000 mg | ORAL_CAPSULE | Freq: Every day | ORAL | 0 refills | Status: DC

## 2023-10-22 ENCOUNTER — Ambulatory Visit (INDEPENDENT_AMBULATORY_CARE_PROVIDER_SITE_OTHER): Payer: PRIVATE HEALTH INSURANCE | Admitting: Physical Therapy

## 2023-10-22 ENCOUNTER — Encounter: Payer: Self-pay | Admitting: Physical Therapy

## 2023-10-22 ENCOUNTER — Other Ambulatory Visit: Payer: Self-pay

## 2023-10-22 ENCOUNTER — Ambulatory Visit (INDEPENDENT_AMBULATORY_CARE_PROVIDER_SITE_OTHER): Payer: PRIVATE HEALTH INSURANCE | Admitting: Sports Medicine

## 2023-10-22 ENCOUNTER — Ambulatory Visit: Payer: PRIVATE HEALTH INSURANCE | Admitting: Physical Therapy

## 2023-10-22 ENCOUNTER — Encounter: Payer: Self-pay | Admitting: Sports Medicine

## 2023-10-22 DIAGNOSIS — M6281 Muscle weakness (generalized): Secondary | ICD-10-CM

## 2023-10-22 DIAGNOSIS — M7918 Myalgia, other site: Secondary | ICD-10-CM

## 2023-10-22 DIAGNOSIS — G5782 Other specified mononeuropathies of left lower limb: Secondary | ICD-10-CM

## 2023-10-22 DIAGNOSIS — G5702 Lesion of sciatic nerve, left lower limb: Secondary | ICD-10-CM

## 2023-10-22 DIAGNOSIS — G8929 Other chronic pain: Secondary | ICD-10-CM | POA: Diagnosis not present

## 2023-10-22 DIAGNOSIS — M5442 Lumbago with sciatica, left side: Secondary | ICD-10-CM

## 2023-10-22 NOTE — Progress Notes (Signed)
Terry Paul - 33 y.o. female MRN [id number]  Date of birth: 03-10-1991  Office Visit Note: Visit Date: 10/22/2023 PCP: Park Meo, FNP Referred by: Park Meo, FNP  Subjective: Chief Complaint  Patient presents with   Left Leg - Follow-up, Pain   HPI: Terry Paul is a pleasant 33 y.o. female who presents today for chronic left posterior hip/buttock pain with sciatica.  Terry Paul has definitely made good progress from her previous procedures.  We last performed an ultrasound-guided sciatic nerve hydrodissection back on 09/07/2023.  Since then she has had resolution of her numbness and tingling in the left foot and in general has improvement in her symptoms from her buttock down to her knee.  She still has pain if she sits for long periods of time but then this will go away much quicker over a period of minutes to an hours compared to days which it has in the past.  She is on her Lyrica 75 mg twice daily which she is tolerating well.  Pertinent ROS were reviewed with the patient and found to be negative unless otherwise specified above in HPI.   Assessment & Plan: Visit Diagnoses:  1. Sciatic neuropathy, left   2. Nerve entrapment of lower limb, left   3. Left buttock pain    Plan: Impression is chronic burning and numbness/tingling sensation from the left buttock down the posterior leg, she did get improvement after our nerve hydrodissection injection most recently on 09/07/2023.  She is having less symptoms and her radiating pattern has progressed from the buttock now to the superior aspect of the knee and no longer going down the leg. Through shared decision-making, we did evaluate this region with ultrasound and under ultrasound guidance repeated a large volume sciatic nerve hydrodissection for the left leg today.  Patient tolerated well.  She will continue her Lyrica 75 mg twice daily.  I would like to see her back at about the 97-month mark for further evaluation.  She may call or  return sooner if any issues arise.  She is progressing through formalized physical therapy, will message her PT to see if they can add in some sciatic nerve glides as well.  Follow-up: Return in about 3 months (around 01/19/2024).   Meds & Orders: No orders of the defined types were placed in this encounter.   Orders Placed This Encounter  Procedures   US Guided Needle Placement - No Linked Charges   [place] Extrem Low Left Ltd     Procedures: US-guided sciatic nerve hydrodissection (neurolysis), left leg: After discussion on risks/benefits/indications, informed verbal consent was obtained. A timeout was then performed. Patient was placed in prone position on the table in exam room with affected leg facing upward in a neutral position. The overlying area was prepped with ChloraPrep and multiple alcohol swabs. Utilizing ultrasound guidance, the sciatic nerve and surrounding neurovasculature was identified. The overlying soft tissue was first anesthetized with 5 cc of lidocaine 1%.  Following this, using ultrasound guidance via an in-plane approach, a 22-gauge, 3.5 inch needle was inserted from a lateral to medial direction around the nerve. Using a mixture of 4:4:30:2 cc's of lidocaine:bupivicaine:D5W:methylprednisolone, the nerve was hydrodissected. The needle was inserted both above and below the nerve with ultrasound visualization of 360 degrees spread and appropriate hydrodissection and lysis of the nerve. Patient tolerated the procedure well without immediate complication.  Band-Aid was applied         Clinical History: No specialty comments available.  She  reports that she has never smoked. She has never used smokeless tobacco.  Recent Labs    04/02/23 1308  LABURIC 4.1    Objective:    Physical Exam  Gen: Well-appearing, in no acute distress; non-toxic CV: Well-perfused. Warm.  Resp: Breathing unlabored on room air; no wheezing. Psych: Fluid speech in conversation; appropriate  affect; normal thought process  Ortho Exam - Left buttock/hip: There is no redness swelling or effusion of the posterior hip.  No significant Tinel's testing over the posterior buttock or near the crease of the sciatic nerve.  There is fluid internal/external logroll and activation of the piriformis musculature.  There is a nonantalgic gait with equivocal strength of bilateral lower extremities.  Imaging: US Guided Needle Placement - No Linked Charges Result Date: 10/22/2023 *Procedurally-successful ultrasound-guided sciatic nerve hydrodissection injection.  [place] Extrem Low Left Ltd Result Date: 10/22/2023 Limited musculoskeletal ultrasound of the left posterior hip, buttock and sciatic nerve was performed today.  The ischial tuberosity was visualized without cortical defect.  The sciatic nerve was identified both superior and inferior to the piriformis musculature.  There is no abnormality of the piriformis muscle belly, which was viewed in static and dynamic views.  The sciatic nerve was visualized just superior to the piriformis musculature. *Procedurally-successful ultrasound-guided sciatic nerve hydrodissection injection.       Past Medical/Family/Surgical/Social History: Medications & Allergies reviewed per EMR, new medications updated. Patient Active Problem List   Diagnosis Date Noted   Somatic dysfunction of spine, sacral 02/05/2023   Left sided sciatica 11/20/2022   Irregular menses 06/12/2019   Underweight 06/12/2019   Family history of thyroid disease in mother 06/12/2019   Environmental and seasonal allergies 06/12/2019   Hypertension in pregnancy, postpartum condition 09/22/2013   Disorder of skin and subcutaneous tissue 07/10/2013   Status post primary low transverse cesarean section 05/01/2013    Class: Status post   Twin pregnancy delivered 05/01/2013    Class: Status post   Twin pregnancy, twins discordant in third trimester 05/01/2013    Class: Present on Admission    Monochorionic diamniotic twin pregnancy 02/13/2013   Past Medical History:  Diagnosis Date   Preeclampsia    UTI (lower urinary tract infection)    Family History  Problem Relation Age of Onset   Healthy Mother    Healthy Father    Past Surgical History:  Procedure Laterality Date   CESAREAN SECTION N/A 04/26/2013   Procedure: Primary cesarean section  with delivery of twin  A girl at 66. Twin B girl at 73.;  Surgeon: Zelphia Cairo, MD;  Location: [facility];  Service: Obstetrics;  Laterality: N/A;   Social History   Occupational History   Not on file  Tobacco Use   Smoking status: Never   Smokeless tobacco: Never  Vaping Use   Vaping status: Never Used  Substance and Sexual Activity   Alcohol use: Never   Drug use: No   Sexual activity: Yes    Partners: Male    Birth control/protection: Other-see comments    Comment: VAS

## 2023-10-22 NOTE — Therapy (Addendum)
 OUTPATIENT PHYSICAL THERAPY THORACOLUMBAR EVALUATION   Patient Name: Terry Paul MRN: [id number] DOB:Sep 14, 1991, 33 y.o., female Today's Date: 10/22/2023  END OF SESSION:  PT End of Session - 10/22/23 1018     Visit Number 1    Number of Visits 16    Date for PT Re-Evaluation 12/17/23    PT Start Time 0845    PT Stop Time 0925    PT Time Calculation (min) 40 min    Activity Tolerance Patient tolerated treatment well    Behavior During Therapy [facility] for tasks assessed/performed             Past Medical History:  Diagnosis Date   Preeclampsia    UTI (lower urinary tract infection)    Past Surgical History:  Procedure Laterality Date   CESAREAN SECTION N/A 04/26/2013   Procedure: Primary cesarean section  with delivery of twin  A girl at 35. Twin B girl at 34.;  Surgeon: Zelphia Cairo, MD;  Location: [facility];  Service: Obstetrics;  Laterality: N/A;   Patient Active Problem List   Diagnosis Date Noted   Somatic dysfunction of spine, sacral 02/05/2023   Left sided sciatica 11/20/2022   Irregular menses 06/12/2019   Underweight 06/12/2019   Family history of thyroid disease in mother 06/12/2019   Environmental and seasonal allergies 06/12/2019   Hypertension in pregnancy, postpartum condition 09/22/2013   Disorder of skin and subcutaneous tissue 07/10/2013   Status post primary low transverse cesarean section 05/01/2013    Class: Status post   Twin pregnancy delivered 05/01/2013    Class: Status post   Twin pregnancy, twins discordant in third trimester 05/01/2013    Class: Present on Admission   Monochorionic diamniotic twin pregnancy 02/13/2013    PCP: Park Meo, FNP   REFERRING PROVIDER: Teryl Lucy, MD   REFERRING DIAG:  Free Text Diagnosis  L Lumbar Radic    Rationale for Evaluation and Treatment: Rehabilitation  THERAPY DIAG:  Chronic left-sided low back pain with left-sided sciatica  Muscle weakness (generalized)  ONSET DATE: January  2024  SUBJECTIVE:  SUBJECTIVE STATEMENT: Pt is 33 y.o. female who presents today for chronic left lower extremity pain as well as numbness and tingling that radiates down from the left buttock/low back down the posterior leg all the way into the foot.    PERTINENT HISTORY:  Preeclampsia, UTI, abdominal diastasis recti from having twins 10 years ago  PAIN:  NPRS scale: 0 /10 pain at rest, in last week 6/10 Pain location: left buttock to back of left knee, can extend to toes Pain description: achy, dull  Aggravating factors: sitting Relieving factors: standing, walking  PRECAUTIONS: None  WEIGHT BEARING RESTRICTIONS: No  FALLS:  Has patient fallen in last 6 months? No  LIVING ENVIRONMENT: Lives with: lives with their family and lives with their spouse Lives in: House/apartment Stairs: Yes: Internal: 1 flight steps; left and then right Has following equipment at home: None  OCCUPATION: nurse   PLOF: Independent  PATIENT GOALS: sit without pain  Next MD Visit:    OBJECTIVE:   DIAGNOSTIC FINDINGS: 09/07/23 Limited musculoskeletal ultrasound of the left posterior hip and buttock  was performed today.  No cortical irregularity of the ischial tuberosity.   The sciatic nerve was identified both superior and inferior to the  piriformis musculature.  The piriformis was seen both static and dynamic  views with out tendinopathy.  The sciatic nerve did set just superior to  the piriformis.  The left sciatic nerve is slightly larger in diameter  compared to the contralateral right side.   PATIENT SURVEYS:  10/22/23: FOTO  eval:    73%   SCREENING FOR RED FLAGS: Bowel or bladder incontinence: No Cauda equina syndrome: No  COGNITION: Overall cognitive status: WFL normal      SENSATION: WFL  MUSCLE LENGTH: Hamstrings: Right 72 deg; Left 80 deg   POSTURE:  forward head  PALPATION: 10/22/23 TTP: left QL, left glute medius, left piriformis, Rt piriformis  LUMBAR ROM:   Directional Preference Assessment: extension   AROM eval  Flexion 85 (finger tips to floor)  Extension 18  Right lateral flexion 35  Left lateral flexion 40  Right rotation WFL  Left rotation WFL   (Blank rows = not tested)  LOWER EXTREMITY ROM:      Right 10/22/23 Left 10/22/23  Hip flexion 120 120  Hip extension    Hip abduction    Hip adduction    Hip internal rotation    Hip external rotation    Knee flexion    Knee extension    Ankle dorsiflexion    Ankle plantarflexion    Ankle inversion    Ankle eversion     (Blank rows = not tested)  LOWER EXTREMITY MMT:    MMT Right eval Left eval  Hip flexion 5 4  Hip extension 5 4  Hip abduction 5 4  Hip adduction 5 4  Hip internal rotation    Hip external rotation    Knee flexion    Knee extension    Ankle dorsiflexion    Ankle plantarflexion    Ankle inversion    Ankle eversion     (Blank rows = not tested)  LUMBAR SPECIAL TESTS:  Slump test: Negative  GAIT: [facility]  TODAY'S TREATMENT:                                                                                                         DATE: 10/22/23  Therex: HEP instruction/performance c cues for techniques, handout provided.  Trial set performed of each for comprehension and symptom assessment.  See below for exercise list Self Care:  Pt edu in Sitting posture and transfer mechanics  PATIENT  EDUCATION:  Education details: HEP, POC Person educated: Patient Education method: Explanation, Demonstration, Verbal cues, and Handouts Education comprehension: verbalized understanding, returned demonstration, and verbal cues required  HOME EXERCISE PROGRAM: Access Code: [email or web address] URL: [email or web address] Date: 10/22/2023 Prepared by: Narda Amber  Exercises - Supine Bridge  - 2 x daily - 7 x weekly - 2 sets - 10 reps - 5 seconds hold - Supine Lower Trunk Rotation  - 2 x daily - 7 x weekly - 3 reps - 20 seconds hold - Prone Off Center Lumbar Extension Press Up  - 2 x daily - 7 x weekly - 3-5 reps - 5-10 seconds hold - Supine Figure 4 Piriformis Stretch  - 2 x daily - 7 x weekly - 3 reps - 20 seconds hold - Supine Figure 4 Piriformis Stretch  - 2 x daily - 7 x weekly - 3 reps - 20 seconds hold  ASSESSMENT:  CLINICAL IMPRESSION: Patient is a 33 y.o. who comes to clinic with complaints of left buttock pain  c radiation down left LE to the back of the knee. Pt with mobility, strength and movement coordination deficits that impair their ability to perform usual daily and recreational functional activities without increase difficulty/symptoms at this time.  Patient to benefit from skilled PT services to address impairments and limitations to improve to previous level of function without restriction secondary to condition.   OBJECTIVE IMPAIRMENTS: decreased ROM, decreased strength, pain  ACTIVITY LIMITATIONS: sitting pain  PARTICIPATION LIMITATIONS: community and work related activities   PERSONAL FACTORS: 1-2 comorbidities: see pertinent history  are also affecting patient's functional outcome.   REHAB POTENTIAL: Good  CLINICAL DECISION MAKING: Stable/uncomplicated  EVALUATION COMPLEXITY: Low   GOALS: Goals reviewed with patient? Yes  SHORT TERM GOALS: (target date for Short term goals are 3 weeks 11/12/2023)  1. Patient will demonstrate independent use of  home exercise program to maintain progress from in clinic treatments.  Goal status: New  LONG TERM GOALS: (target dates for all long term goals are 10 weeks  12/17/2023 )   1. Patient will demonstrate/report pain at worst less than or equal to 2/10 to facilitate minimal limitation in daily activity secondary to pain symptoms.  Goal status: New   2. Patient will demonstrate independent use of home exercise program to facilitate ability to maintain/progress functional gains from skilled physical therapy services.  Goal status: New   3. Patient will demonstrate FOTO outcome > or = 77 % to indicate reduced disability due to condition.  Goal status: New   4. Patient will demonstrate lumbar extension >/= 30 degrees WFL s symptoms to facilitate upright  standing, walking posture at PLOF s limitation.  Goal status: New   5.  Pt will be able to report sitting for 30 minutes with pain </= 2/10 down her left LE and buttocks.   Goal status: New   6.  Pt will improve her Left hip strength to 5/5 for improved functional mobility.  Goal status: New     PLAN:  PT FREQUENCY: 1-2x/week  PT DURATION: 8 weeks  PLANNED INTERVENTIONS: Can include 16109- PT Re-evaluation, 97110-Therapeutic exercises, 97530- Therapeutic activity, [id number]- Neuromuscular re-education, 97535- Self Care, 97140- Manual therapy, [phone]- Gait training, [phone]- Orthotic Fit/training, [phone]- Canalith repositioning, [id number]- Aquatic Therapy, 97014- Electrical stimulation (unattended), 97750 Physical performance testing, [id number]- Electrical stimulation (manual), 97016- Vasopneumatic device, [id number]- Ultrasound, [id number]- Traction (mechanical), [id number]- Ionotophoresis 4mg /ml Dexamethasone, Patient/Family education, Balance training, Stair training, Taping, Dry Needling, Joint mobilization, Joint manipulation, Spinal manipulation, Spinal mobilization, Scar mobilization, Vestibular training, Visual/preceptual remediation/compensation, DME  instructions, Cryotherapy, and Moist heat.  All performed as medically necessary.  All included unless contraindicated  PLAN FOR NEXT SESSION: Review HEP knowledge/results. Discussed DN and issued handout,  Manual therapy and mobs as needed Core strengthening (pt with diastasis recti )       Sharmon Leyden, PT, MPT 10/22/2023, 10:20 AM

## 2023-10-22 NOTE — Progress Notes (Signed)
Patient says that the symptoms that she had in the left foot prior to the last injection have gone away. She still has pain if she sits for too long that starts in the glute and goes to the back of the knee, but says that the pain does not linger as long as it used to. She says that previously if she sat too long the pain would linger for days, and now it will linger for a couple of hours.

## 2023-10-23 ENCOUNTER — Encounter: Payer: Self-pay | Admitting: Physical Therapy

## 2023-10-24 ENCOUNTER — Ambulatory Visit: Payer: PRIVATE HEALTH INSURANCE | Admitting: Orthopedic Surgery

## 2023-10-24 ENCOUNTER — Ambulatory Visit: Payer: PRIVATE HEALTH INSURANCE | Admitting: Physical Therapy

## 2023-10-29 ENCOUNTER — Other Ambulatory Visit: Payer: Self-pay | Admitting: Family Medicine

## 2023-10-31 ENCOUNTER — Ambulatory Visit: Payer: PRIVATE HEALTH INSURANCE | Admitting: Neurosurgery

## 2023-11-02 ENCOUNTER — Encounter: Payer: PRIVATE HEALTH INSURANCE | Admitting: Physical Therapy

## 2023-11-07 ENCOUNTER — Ambulatory Visit (INDEPENDENT_AMBULATORY_CARE_PROVIDER_SITE_OTHER): Payer: PRIVATE HEALTH INSURANCE | Admitting: Physical Therapy

## 2023-11-07 ENCOUNTER — Encounter: Payer: Self-pay | Admitting: Physical Therapy

## 2023-11-07 DIAGNOSIS — G8929 Other chronic pain: Secondary | ICD-10-CM

## 2023-11-07 DIAGNOSIS — M6281 Muscle weakness (generalized): Secondary | ICD-10-CM | POA: Diagnosis not present

## 2023-11-07 DIAGNOSIS — M5442 Lumbago with sciatica, left side: Secondary | ICD-10-CM | POA: Diagnosis not present

## 2023-11-07 NOTE — Therapy (Signed)
 OUTPATIENT PHYSICAL THERAPY TREATMENT   Patient Name: Terry Paul MRN: [id number] DOB:Feb 20, 1991, 33 y.o., female Today's Date: 11/07/2023  END OF SESSION:  PT End of Session - 11/07/23 0946     Visit Number 2    Number of Visits 16    Date for PT Re-Evaluation 12/17/23    PT Start Time 0933    PT Stop Time 1006    PT Time Calculation (min) 33 min    Activity Tolerance Patient tolerated treatment well    Behavior During Therapy WFL for tasks assessed/performed              Past Medical History:  Diagnosis Date   Preeclampsia    UTI (lower urinary tract infection)    Past Surgical History:  Procedure Laterality Date   CESAREAN SECTION N/A 04/26/2013   Procedure: Primary cesarean section  with delivery of twin  A girl at 78. Twin B girl at 85.;  Surgeon: Zelphia Cairo, MD;  Location: [facility];  Service: Obstetrics;  Laterality: N/A;   Patient Active Problem List   Diagnosis Date Noted   Somatic dysfunction of spine, sacral 02/05/2023   Left sided sciatica 11/20/2022   Irregular menses 06/12/2019   Underweight 06/12/2019   Family history of thyroid disease in mother 06/12/2019   Environmental and seasonal allergies 06/12/2019   Hypertension in pregnancy, postpartum condition 09/22/2013   Disorder of skin and subcutaneous tissue 07/10/2013   Status post primary low transverse cesarean section 05/01/2013    Class: Status post   Twin pregnancy delivered 05/01/2013    Class: Status post   Twin pregnancy, twins discordant in third trimester 05/01/2013    Class: Present on Admission   Monochorionic diamniotic twin pregnancy 02/13/2013    PCP: Park Meo, FNP   REFERRING PROVIDER: Teryl Lucy, MD   REFERRING DIAG:  Free Text Diagnosis  L Lumbar Radic    Rationale for Evaluation and Treatment: Rehabilitation  THERAPY DIAG:  Chronic left-sided low back pain with left-sided sciatica  Muscle weakness (generalized)  ONSET DATE: January  2024  SUBJECTIVE:  SUBJECTIVE STATEMENT: Feels symptoms are about the same; still radiating to knee.   PERTINENT HISTORY:  Preeclampsia, UTI, abdominal diastasis recti from having twins 10 years ago  PAIN:  NPRS scale: 0 /10 pain at rest, in last week 6/10 Pain location: left buttock to back of left knee, can extend to toes Pain description: achy, dull  Aggravating factors: sitting Relieving factors: standing, walking  PRECAUTIONS: None  WEIGHT BEARING RESTRICTIONS: No  FALLS:  Has patient fallen in last 6 months? No  LIVING ENVIRONMENT: Lives with: lives with their family and lives with their spouse Lives in: House/apartment Stairs: Yes: Internal: 1 flight steps; left and then right Has following equipment at home: None  OCCUPATION: nurse   PLOF: Independent  PATIENT GOALS: sit without pain  Next MD Visit:    OBJECTIVE:   DIAGNOSTIC FINDINGS: 09/07/23 Limited musculoskeletal ultrasound of the left posterior hip and buttock  was performed today.  No cortical irregularity of the ischial tuberosity.   The sciatic nerve was identified both superior and inferior to the  piriformis musculature.  The piriformis was seen both static and dynamic  views with out tendinopathy.  The sciatic nerve did set just superior to  the piriformis.  The left sciatic nerve is slightly larger in diameter  compared to the contralateral right side.   PATIENT SURVEYS:  10/22/23: FOTO eval:    73%   SCREENING FOR RED FLAGS: Bowel or bladder incontinence: No Cauda equina syndrome: No  COGNITION: Overall cognitive status: WFL  normal      SENSATION: WFL  MUSCLE LENGTH: Hamstrings: Right 72 deg; Left 80 deg   POSTURE:  forward head  PALPATION: 10/22/23 TTP: left QL, left glute medius, left piriformis, Rt piriformis  LUMBAR ROM:   Directional Preference Assessment: extension   AROM eval  Flexion 85 (finger tips to floor)  Extension 18  Right lateral flexion 35  Left lateral flexion 40  Right rotation WFL  Left rotation WFL   (Blank rows = not tested)  LOWER EXTREMITY ROM:      Right 10/22/23 Left 10/22/23  Hip flexion 120 120  Hip extension    Hip abduction    Hip adduction    Hip internal rotation    Hip external rotation    Knee flexion    Knee extension    Ankle dorsiflexion    Ankle plantarflexion    Ankle inversion    Ankle eversion     (Blank rows = not tested)  LOWER EXTREMITY MMT:    MMT Right eval Left eval  Hip flexion 5 4  Hip extension 5 4  Hip abduction 5 4  Hip adduction 5 4  Hip internal rotation    Hip external rotation    Knee flexion    Knee extension    Ankle dorsiflexion    Ankle plantarflexion    Ankle inversion    Ankle eversion     (Blank rows = not tested)  LUMBAR SPECIAL TESTS:  Slump test: Negative  GAIT: [facility]  TODAY'S TREATMENT DATE:  11/07/23 TherEx Sciatic nerve slides/glides segmentals on Lt hip, knee and ankle 5x5 sec hold each Review of HEP with modifications to piriformis stretch LLD assessment with limbs equal length  Manual STM with compression to Lt glutes and piriformis; skilled palpation and monitoring of soft tissue during DN; trigger points noted in piriformis and glute max; tenderness at ischial tuberosity  Trigger Point Dry Needling  Initial Treatment: Pt instructed on Dry Needling rational, procedures, and possible  side effects. Pt instructed to expect mild to moderate muscle soreness later in the day and/or into the next day.  Pt instructed in methods to reduce muscle soreness. Pt instructed to continue prescribed HEP. Patient was educated on signs and symptoms of infection and other risk factors and advised to seek medical attention should they occur.  Patient verbalized understanding of these instructions and education.   Patient Verbal Consent Given: Yes Education Handout Provided: Yes Muscles Treated: Lt piriformis, glute max Electrical Stimulation Performed: No Treatment Response/Outcome: twitch responses noted  Self Care Educated on goal of extension based exercises; plan of care and activity modifications PRN  10/22/23  Therex: HEP instruction/performance c cues for techniques, handout provided.  Trial set performed of each for comprehension and symptom assessment.  See below for exercise list Self Care:  Pt edu in Sitting posture and transfer mechanics  PATIENT EDUCATION:  Education details: HEP, POC Person educated: Patient Education method: Explanation, Demonstration, Verbal cues, and Handouts Education comprehension: verbalized understanding, returned demonstration, and verbal cues required  HOME EXERCISE PROGRAM: Access Code: [email or web address] URL: [email or web address] Date: 11/07/2023 Prepared by: Moshe Cipro  Exercises - Supine Bridge  - 2 x daily - 7 x weekly - 2 sets - 10 reps - 5 seconds hold - Supine Lower Trunk Rotation  - 2 x daily - 7 x weekly - 3 reps - 20 seconds hold - Prone Off Center Lumbar Extension Press Up  - 2 x daily - 7 x weekly - 3-5 reps - 5-10 seconds hold - Supine Figure 4 Piriformis Stretch  - 2 x daily - 7 x weekly - 3 reps - 20 seconds hold - Supine Figure 4 Piriformis Stretch  - 2 x daily - 7 x weekly - 3 reps - 20 seconds hold - Hip Neural Slide/Glide  - 2 x daily - 7 x weekly - 1 sets - 10 reps - 5-10 sec hold - Knee Nerve  Slide/Glide   - 1 x daily - 7 x weekly - 3 sets - 10 reps  ASSESSMENT:  CLINICAL IMPRESSION: Pt tolerated session well today; did trial dry needling with manual therapy to see how she responds.  Will continue to benefit from PT to maximize function.  OBJECTIVE IMPAIRMENTS: decreased mobility, difficulty walking, decreased ROM, decreased strength, improper body mechanics, postural dysfunction, and pain.   ACTIVITY LIMITATIONS: lifting, bending, sitting, squatting, sleeping, and transfers  PARTICIPATION LIMITATIONS: meal prep, cleaning, laundry, community activity, and occupation  PERSONAL FACTORS: 1-2 comorbidities: see pertinent history  are also affecting patient's functional outcome.   REHAB POTENTIAL: Good  CLINICAL DECISION MAKING: Stable/uncomplicated  EVALUATION COMPLEXITY: Low   GOALS: Goals reviewed with patient? Yes  SHORT TERM GOALS: (target date for Short term goals are 3 weeks 11/12/2023)  1. Patient will demonstrate independent use of home exercise program to maintain progress from in clinic treatments.  Goal status: New  LONG TERM GOALS: (target dates for all long term goals are 10 weeks  12/17/2023 )   1. Patient will  demonstrate/report pain at worst less than or equal to 2/10 to facilitate minimal limitation in daily activity secondary to pain symptoms.  Goal status: New   2. Patient will demonstrate independent use of home exercise program to facilitate ability to maintain/progress functional gains from skilled physical therapy services.  Goal status: New   3. Patient will demonstrate FOTO outcome > or = 77 % to indicate reduced disability due to condition.  Goal status: New   4. Patient will demonstrate lumbar extension >/= 30 degrees WFL s symptoms to facilitate upright standing, walking posture at PLOF s limitation.  Goal status: New   5.  Pt will be able to report sitting for 30 minutes with pain </= 2/10 down her left LE and buttocks.   Goal  status: New   6.  Pt will improve her Left hip strength to 5/5 for improved functional mobility.  Goal status: New     PLAN:  PT FREQUENCY: 1-2x/week  PT DURATION: 8 weeks  PLANNED INTERVENTIONS: Can include 81191- PT Re-evaluation, 97110-Therapeutic exercises, 97530- Therapeutic activity, [id number]- Neuromuscular re-education, 97535- Self Care, 97140- Manual therapy, [phone]- Gait training, [phone]- Orthotic Fit/training, [phone]- Canalith repositioning, [id number]- Aquatic Therapy, 97014- Electrical stimulation (unattended), 97750 Physical performance testing, [id number]- Electrical stimulation (manual), 97016- Vasopneumatic device, [id number]- Ultrasound, [id number]- Traction (mechanical), [id number]- Ionotophoresis 4mg /ml Dexamethasone, Patient/Family education, Balance training, Stair training, Taping, Dry Needling, Joint mobilization, Joint manipulation, Spinal manipulation, Spinal mobilization, Scar mobilization, Vestibular training, Visual/preceptual remediation/compensation, DME instructions, Cryotherapy, and Moist heat.  All performed as medically necessary.  All included unless contraindicated  PLAN FOR NEXT SESSION: Review HEP PRN. Assess response to DN Core strengthening (pt with diastasis recti )       Clarita Crane, PT, DPT 11/07/23 10:18 AM

## 2023-11-07 NOTE — Patient Instructions (Signed)

## 2023-11-09 ENCOUNTER — Encounter: Payer: PRIVATE HEALTH INSURANCE | Admitting: Physical Therapy

## 2023-11-09 NOTE — Progress Notes (Deleted)
 Note created in error.

## 2023-11-14 ENCOUNTER — Encounter: Payer: PRIVATE HEALTH INSURANCE | Admitting: Physical Therapy

## 2023-11-15 ENCOUNTER — Encounter: Payer: Self-pay | Admitting: Physical Therapy

## 2023-11-15 ENCOUNTER — Ambulatory Visit (INDEPENDENT_AMBULATORY_CARE_PROVIDER_SITE_OTHER): Payer: PRIVATE HEALTH INSURANCE | Admitting: Physical Therapy

## 2023-11-15 DIAGNOSIS — M6281 Muscle weakness (generalized): Secondary | ICD-10-CM

## 2023-11-15 DIAGNOSIS — M5442 Lumbago with sciatica, left side: Secondary | ICD-10-CM

## 2023-11-15 DIAGNOSIS — G8929 Other chronic pain: Secondary | ICD-10-CM

## 2023-11-15 NOTE — Therapy (Signed)
 OUTPATIENT PHYSICAL THERAPY TREATMENT   Patient Name: Marieelena Bartko MRN: [id number] DOB:[birth year], 33 y.o., female Today's Date: 11/15/2023  END OF SESSION:  PT End of Session - 11/15/23 1431     Visit Number 3    Number of Visits 16    Date for PT Re-Evaluation 12/17/23    PT Start Time 1430    PT Stop Time 1500    PT Time Calculation (min) 30 min    Activity Tolerance Patient tolerated treatment well    Behavior During Therapy WFL for tasks assessed/performed               Past Medical History:  Diagnosis Date   Preeclampsia    UTI (lower urinary tract infection)    Past Surgical History:  Procedure Laterality Date   CESAREAN SECTION N/A 04/26/2013   Procedure: Primary cesarean section  with delivery of twin  A girl at 44. Twin B girl at 31.;  Surgeon: Zelphia Cairo, MD;  Location: [facility];  Service: Obstetrics;  Laterality: N/A;   Patient Active Problem List   Diagnosis Date Noted   Somatic dysfunction of spine, sacral 02/05/2023   Left sided sciatica 11/20/2022   Irregular menses 06/12/2019   Underweight 06/12/2019   Family history of thyroid disease in mother 06/12/2019   Environmental and seasonal allergies 06/12/2019   Hypertension in pregnancy, postpartum condition 09/22/2013   Disorder of skin and subcutaneous tissue 07/10/2013   Status post primary low transverse cesarean section 05/01/2013    Class: Status post   Twin pregnancy delivered 05/01/2013    Class: Status post   Twin pregnancy, twins discordant in third trimester 05/01/2013    Class: Present on Admission   Monochorionic diamniotic twin pregnancy 02/13/2013    PCP: Park Meo, FNP   REFERRING PROVIDER: Teryl Lucy, MD   REFERRING DIAG:  Free Text Diagnosis  L Lumbar Radic    Rationale for Evaluation and Treatment: Rehabilitation  THERAPY DIAG:  Chronic left-sided low back pain with left-sided sciatica  Muscle weakness (generalized)  ONSET DATE: January  2024  SUBJECTIVE:  SUBJECTIVE STATEMENT: Felt less pressure with exercises following needling   PERTINENT HISTORY:  Preeclampsia, UTI, abdominal diastasis recti from having twins 10 years ago  PAIN:  NPRS scale: 0 /10 pain at rest, in last week 6/10 Pain location: left buttock to back of left knee, can extend to toes Pain description: achy, dull  Aggravating factors: sitting Relieving factors: standing, walking  PRECAUTIONS: None  WEIGHT BEARING RESTRICTIONS: No  FALLS:  Has patient fallen in last 6 months? No  LIVING ENVIRONMENT: Lives with: lives with their family and lives with their spouse Lives in: House/apartment Stairs: Yes: Internal: 1 flight steps; left and then right Has following equipment at home: None  OCCUPATION: nurse   PLOF: Independent  PATIENT GOALS: sit without pain   OBJECTIVE:   DIAGNOSTIC FINDINGS: 09/07/23 Limited musculoskeletal ultrasound of the left posterior hip and buttock  was performed today.  No cortical irregularity of the ischial tuberosity.   The sciatic nerve was identified both superior and inferior to the  piriformis musculature.  The piriformis was seen both static and dynamic  views with out tendinopathy.  The sciatic nerve did set just superior to  the piriformis.  The left sciatic nerve is slightly larger in diameter  compared to the contralateral right side.   PATIENT SURVEYS:  10/22/23: FOTO eval:    73%   SCREENING FOR RED FLAGS: Bowel or bladder incontinence: No Cauda equina syndrome: No  COGNITION: Overall cognitive status: WFL  normal      SENSATION: WFL  MUSCLE LENGTH: Hamstrings: Right 72 deg; Left 80 deg   POSTURE:  forward head  PALPATION: 10/22/23 TTP: left QL, left glute medius, left piriformis, Rt piriformis  LUMBAR ROM:   Directional Preference Assessment: extension   AROM eval  Flexion 85 (finger tips to floor)  Extension 18  Right lateral flexion 35  Left lateral flexion 40  Right rotation WFL  Left rotation WFL   (Blank rows = not tested)  LOWER EXTREMITY ROM:      Right 10/22/23 Left 10/22/23  Hip flexion 120 120  Hip extension    Hip abduction    Hip adduction    Hip internal rotation    Hip external rotation    Knee flexion    Knee extension    Ankle dorsiflexion    Ankle plantarflexion    Ankle inversion    Ankle eversion     (Blank rows = not tested)  LOWER EXTREMITY MMT:    MMT Right eval Left eval  Hip flexion 5 4  Hip extension 5 4  Hip abduction 5 4  Hip adduction 5 4   (Blank rows = not tested)  LUMBAR SPECIAL TESTS:  Slump test: Negative  GAIT: [facility]  TODAY'S TREATMENT DATE:  11/15/23 TherEx Review of sciatic nerve slides/glides segmentals on Lt hip, knee and ankle 5x5 sec hold each Bridge with isometric hip abduction x 5 reps Discussed diastasis and core modifications if doming present with home workouts; pt verbalized understanding  Manual STM with compression to bil glutes and piriformis; skilled palpation and monitoring of soft tissue during DN; trigger points noted in piriformis and glute max; tenderness at ischial tuberosity  Trigger Point Dry Needling  Subsequent Treatment: Instructions provided previously at initial dry needling treatment.   Patient Verbal Consent Given: Yes Education Handout Provided: Previously Provided Muscles  Treated: bil glute min, piriformis Electrical Stimulation Performed: No Treatment Response/Outcome: twitch responses noted     11/07/23 TherEx Sciatic nerve slides/glides segmentals on Lt hip, knee and ankle 5x5 sec hold each Review of HEP with modifications to piriformis stretch LLD assessment with limbs equal length  Manual STM with compression to Lt glutes and piriformis; skilled palpation and monitoring of soft tissue during DN; trigger points noted in piriformis and glute max; tenderness at ischial tuberosity  Trigger Point Dry Needling  Initial Treatment: Pt instructed on Dry Needling rational, procedures, and possible side effects. Pt instructed to expect mild to moderate muscle soreness later in the day and/or into the next day.  Pt instructed in methods to reduce muscle soreness. Pt instructed to continue prescribed HEP. Patient was educated on signs and symptoms of infection and other risk factors and advised to seek medical attention should they occur.  Patient verbalized understanding of these instructions and education.   Patient Verbal Consent Given: Yes Education Handout Provided: Yes Muscles Treated: Lt piriformis, glute max Electrical Stimulation Performed: No Treatment Response/Outcome: twitch responses noted  Self Care Educated on goal of extension based exercises; plan of care and activity modifications PRN  10/22/23  Therex: HEP instruction/performance c cues for techniques, handout provided.  Trial set performed of each for comprehension and symptom assessment.  See below for exercise list Self Care:  Pt edu in Sitting posture and transfer mechanics  PATIENT EDUCATION:  Education details: HEP, POC Person educated: Patient Education method: Explanation, Demonstration, Verbal cues, and Handouts Education comprehension: verbalized understanding, returned demonstration, and verbal cues required  HOME EXERCISE PROGRAM: Access Code: [email or web address] URL:  [email or web address] Date: 11/07/2023 Prepared by: Moshe Cipro  Exercises - Supine Bridge  - 2 x daily - 7 x weekly - 2 sets - 10 reps - 5 seconds hold - Supine Lower Trunk Rotation  - 2 x daily - 7 x weekly - 3 reps - 20 seconds hold - Prone Off Center Lumbar Extension Press Up  - 2 x daily - 7 x weekly - 3-5 reps - 5-10 seconds hold - Supine Figure 4 Piriformis Stretch  - 2 x daily - 7 x weekly - 3 reps - 20 seconds hold - Supine Figure 4 Piriformis Stretch  - 2 x daily - 7 x weekly - 3 reps - 20 seconds hold - Hip Neural Slide/Glide  - 2 x daily - 7 x weekly - 1 sets - 10 reps - 5-10 sec hold - Knee Nerve Slide/Glide   - 1 x daily - 7 x weekly - 3 sets - 10 reps  ASSESSMENT:  CLINICAL IMPRESSION: Pt with slight improvement in pain following last session so repeated DN bil today.  Good twitch response noted with Lt stronger than Rt.  Continue skilled PT.   OBJECTIVE IMPAIRMENTS: decreased ROM, decreased strength, pain   ACTIVITY LIMITATIONS: sitting pain  PARTICIPATION LIMITATIONS: community and work related activities    PERSONAL FACTORS: 1-2 comorbidities: see pertinent history  are also affecting patient's functional outcome.   REHAB POTENTIAL: Good  CLINICAL DECISION MAKING: Stable/uncomplicated  EVALUATION COMPLEXITY: Low   GOALS: Goals reviewed with patient? Yes  SHORT TERM GOALS: (target date for Short term goals are 3 weeks 11/12/2023)  1. Patient will demonstrate independent use of home exercise program to maintain progress from in clinic treatments.  Goal status: New  LONG TERM GOALS: (target dates for all long term goals are 10 weeks  12/17/2023 )   1. Patient will demonstrate/report pain at worst less than or equal to 2/10 to facilitate minimal limitation in daily activity secondary to pain symptoms.  Goal status: New   2. Patient will demonstrate independent use of home exercise program to facilitate ability to maintain/progress  functional gains from skilled physical therapy services.  Goal status: New   3. Patient will demonstrate FOTO outcome > or = 77 % to indicate reduced disability due to condition.  Goal status: New   4. Patient will demonstrate lumbar extension >/= 30 degrees WFL s symptoms to facilitate upright standing, walking posture at PLOF s limitation.  Goal status: New   5.  Pt will be able to report sitting for 30 minutes with pain </= 2/10 down her left LE and buttocks.   Goal status: New   6.  Pt will improve her Left hip strength to 5/5 for improved functional mobility.  Goal status: New     PLAN:  PT FREQUENCY: 1-2x/week  PT DURATION: 8 weeks  PLANNED INTERVENTIONS: Can include 09811- PT Re-evaluation, 97110-Therapeutic exercises, 97530- Therapeutic activity, [id number]- Neuromuscular re-education, 97535- Self Care, 97140- Manual therapy, [phone]- Gait training, [phone]- Orthotic Fit/training, [phone]- Canalith repositioning, [id number]- Aquatic Therapy, 97014- Electrical stimulation (unattended), 97750 Physical performance testing, [id number]- Electrical stimulation (manual), 97016- Vasopneumatic device, [id number]- Ultrasound, [id number]- Traction (mechanical), [id number]- Ionotophoresis 4mg /ml Dexamethasone, Patient/Family education, Balance training, Stair training, Taping, Dry Needling, Joint mobilization, Joint manipulation, Spinal manipulation, Spinal mobilization, Scar mobilization, Vestibular training, Visual/preceptual remediation/compensation, DME instructions, Cryotherapy, and Moist heat.  All performed as medically necessary.  All included unless contraindicated  PLAN FOR NEXT SESSION: Assess response to DN; repeat PRN; hamstring strengthening (trial deadlifts) Core strengthening (pt with diastasis recti )   Next MD Visit: 01/18/24    Clarita Crane, PT, DPT 11/15/23 3:08 PM

## 2023-11-21 ENCOUNTER — Encounter: Payer: Self-pay | Admitting: Physical Therapy

## 2023-11-21 ENCOUNTER — Ambulatory Visit (INDEPENDENT_AMBULATORY_CARE_PROVIDER_SITE_OTHER): Payer: PRIVATE HEALTH INSURANCE | Admitting: Physical Therapy

## 2023-11-21 DIAGNOSIS — M5442 Lumbago with sciatica, left side: Secondary | ICD-10-CM | POA: Diagnosis not present

## 2023-11-21 DIAGNOSIS — G8929 Other chronic pain: Secondary | ICD-10-CM

## 2023-11-21 NOTE — Therapy (Signed)
 OUTPATIENT PHYSICAL THERAPY TREATMENT   Patient Name: Shandee Jergens MRN: [id number] DOB:1991/09/09, 33 y.o., female Today's Date: 11/21/2023  END OF SESSION:  PT End of Session - 11/21/23 1349     Visit Number 4    Number of Visits 16    Date for PT Re-Evaluation 12/17/23    PT Start Time 1348    PT Stop Time 1420    PT Time Calculation (min) 32 min    Activity Tolerance Patient tolerated treatment well    Behavior During Therapy WFL for tasks assessed/performed                Past Medical History:  Diagnosis Date   Preeclampsia    UTI (lower urinary tract infection)    Past Surgical History:  Procedure Laterality Date   CESAREAN SECTION N/A 04/26/2013   Procedure: Primary cesarean section  with delivery of twin  A girl at 29. Twin B girl at 62.;  Surgeon: Zelphia Cairo, MD;  Location: [facility];  Service: Obstetrics;  Laterality: N/A;   Patient Active Problem List   Diagnosis Date Noted   Somatic dysfunction of spine, sacral 02/05/2023   Left sided sciatica 11/20/2022   Irregular menses 06/12/2019   Underweight 06/12/2019   Family history of thyroid disease in mother 06/12/2019   Environmental and seasonal allergies 06/12/2019   Hypertension in pregnancy, postpartum condition 09/22/2013   Disorder of skin and subcutaneous tissue 07/10/2013   Status post primary low transverse cesarean section 05/01/2013    Class: Status post   Twin pregnancy delivered 05/01/2013    Class: Status post   Twin pregnancy, twins discordant in third trimester 05/01/2013    Class: Present on Admission   Monochorionic diamniotic twin pregnancy 02/13/2013    PCP: Park Meo, FNP   REFERRING PROVIDER: Teryl Lucy, MD   REFERRING DIAG:  Free Text Diagnosis  L Lumbar Radic    Rationale for Evaluation and Treatment: Rehabilitation  THERAPY DIAG:  Chronic left-sided low back pain with left-sided sciatica  ONSET DATE: January 2024  SUBJECTIVE:  SUBJECTIVE STATEMENT: Doing well; sat in the car for 35 min without any difficulty   PERTINENT HISTORY:  Preeclampsia, UTI, abdominal diastasis recti from having twins 10 years ago  PAIN:  NPRS scale: 0 /10 pain at rest, in last week 6/10 Pain location: left buttock to back of left knee, can extend to toes Pain description: achy, dull  Aggravating factors: sitting Relieving factors: standing, walking  PRECAUTIONS: None  WEIGHT BEARING RESTRICTIONS: No  FALLS:  Has patient fallen in last 6 months? No  LIVING ENVIRONMENT: Lives with: lives with their family and lives with their spouse Lives in: House/apartment Stairs: Yes: Internal: 1 flight steps; left and then right Has following equipment at home: None  OCCUPATION: nurse   PLOF: Independent  PATIENT GOALS: sit without pain   OBJECTIVE:   DIAGNOSTIC FINDINGS: 09/07/23 Limited musculoskeletal ultrasound of the left posterior hip and buttock  was performed today.  No cortical irregularity of the ischial tuberosity.   The sciatic nerve was identified both superior and inferior to the  piriformis musculature.  The piriformis was seen both static and dynamic  views with out tendinopathy.  The sciatic nerve did set just superior to  the piriformis.  The left sciatic nerve is slightly larger in diameter  compared to the contralateral right side.   PATIENT SURVEYS:  10/22/23: FOTO eval:    73%   SCREENING FOR RED FLAGS: Bowel or bladder incontinence: No Cauda equina syndrome: No  COGNITION: Overall cognitive status: WFL normal      SENSATION: WFL  MUSCLE LENGTH: Hamstrings:  Right 72 deg; Left 80 deg   POSTURE:  forward head  PALPATION: 10/22/23 TTP: left QL, left glute medius, left piriformis, Rt piriformis  LUMBAR ROM:   Directional Preference Assessment: extension   AROM eval  Flexion 85 (finger tips to floor)  Extension 18  Right lateral flexion 35  Left lateral flexion 40  Right rotation WFL  Left rotation WFL   (Blank rows = not tested)  LOWER EXTREMITY ROM:      Right 10/22/23 Left 10/22/23  Hip flexion 120 120  Hip extension    Hip abduction    Hip adduction    Hip internal rotation    Hip external rotation    Knee flexion    Knee extension    Ankle dorsiflexion    Ankle plantarflexion    Ankle inversion    Ankle eversion     (Blank rows = not tested)  LOWER EXTREMITY MMT:    MMT Right eval Left eval  Hip flexion 5 4  Hip extension 5 4  Hip abduction 5 4  Hip adduction 5 4   (Blank rows = not tested)  LUMBAR SPECIAL TESTS:  Slump test: Negative  GAIT: [facility]  TODAY'S TREATMENT DATE:  11/21/23 TherEx Trial of high hamstring stretch with minimal stretch felt Updated HEP and added SI stabilization exercises - discussed exercises and how to perform  Manual STM with compression to bil glutes, piriformis and proximal hamstrings; skilled palpation and monitoring of soft tissue during DN  Trigger Point Dry Needling  Subsequent Treatment: Instructions provided previously at initial dry needling treatment.   Patient Verbal Consent Given: Yes Education Handout Provided: Previously Provided Muscles Treated: bil piriformis, proximal hamstring Electrical Stimulation Performed: No Treatment Response/Outcome: twitch responses noted; decreased pain with squat following  11/15/23 TherEx Review of sciatic nerve  slides/glides segmentals on Lt hip, knee and ankle 5x5 sec hold each Bridge with isometric hip abduction x 5 reps Discussed diastasis and core modifications if doming present with home workouts; pt verbalized understanding  Manual STM with compression to bil glutes and piriformis; skilled palpation and monitoring of soft tissue during DN; trigger points noted in piriformis and glute max; tenderness at ischial tuberosity  Trigger Point Dry Needling  Subsequent Treatment: Instructions provided previously at initial dry needling treatment.   Patient Verbal Consent Given: Yes Education Handout Provided: Previously Provided Muscles Treated: bil glute min, piriformis Electrical Stimulation Performed: No Treatment Response/Outcome: twitch responses noted     11/07/23 TherEx Sciatic nerve slides/glides segmentals on Lt hip, knee and ankle 5x5 sec hold each Review of HEP with modifications to piriformis stretch LLD assessment with limbs equal length  Manual STM with compression to Lt glutes and piriformis; skilled palpation and monitoring of soft tissue during DN; trigger points noted in piriformis and glute max; tenderness at ischial tuberosity  Trigger Point Dry Needling  Initial Treatment: Pt instructed on Dry Needling rational, procedures, and possible side effects. Pt instructed to expect mild to moderate muscle soreness later in the day and/or into the next day.  Pt instructed in methods to reduce muscle soreness. Pt instructed to continue prescribed HEP. Patient was educated on signs and symptoms of infection and other risk factors and advised to seek medical attention should they occur.  Patient verbalized understanding of these instructions and education.   Patient Verbal Consent Given: Yes Education Handout Provided: Yes Muscles Treated: Lt piriformis, glute max Electrical Stimulation Performed: No Treatment Response/Outcome: twitch responses noted  Self Care Educated on  goal of extension based exercises; plan of care and activity modifications PRN  10/22/23  Therex: HEP instruction/performance c cues for techniques, handout provided.  Trial set performed of each for comprehension and symptom assessment.  See below for exercise list Self Care:  Pt edu in Sitting posture and transfer mechanics  PATIENT EDUCATION:  Education details: HEP, POC Person educated: Patient Education method: Explanation, Demonstration, Verbal cues, and Handouts Education comprehension: verbalized understanding, returned demonstration, and verbal cues required  HOME EXERCISE PROGRAM: Access Code: [email or web address] URL: [email or web address] Date: 11/21/2023 Prepared by: Moshe Cipro  Exercises - Supine Cervical Retraction with Towel  - 1 x daily - 7 x weekly - 10 reps - 5 seconds hold - Supine Bridge  - 2 x daily - 7 x weekly - 2 sets - 10 reps - 5 seconds hold - Supine Lower Trunk Rotation  - 2 x daily - 7 x weekly - 3 reps - 20 seconds hold - Hip Neural Slide/Glide  - 2 x daily - 7 x weekly - 1 sets - 10 reps - 5-10 sec hold - Knee Nerve Slide/Glide   - 1 x daily - 7 x weekly - 3 sets - 10 reps -  Supine Bridge with Resistance Band  - 1 x daily - 7 x weekly - 1 sets - 10 reps - 5 seconds hold - Hooklying Isometric Hip Abduction with Belt  - 1 x daily - 7 x weekly - 1 sets - 10 reps - 5 sec hold  ASSESSMENT:  CLINICAL IMPRESSION: Trial of DN and manual therapy to proximal hamstrings today with reduction in symptoms reported with squat following.  Overall she's reporting functional improvements in symptoms at this time, continue skilled PT.   OBJECTIVE IMPAIRMENTS: decreased ROM, decreased strength, pain   ACTIVITY LIMITATIONS: sitting pain   PARTICIPATION LIMITATIONS: community and work related activities    PERSONAL FACTORS: 1-2 comorbidities: see pertinent history  are also affecting patient's functional outcome.   REHAB POTENTIAL: Good  CLINICAL  DECISION MAKING: Stable/uncomplicated  EVALUATION COMPLEXITY: Low   GOALS: Goals reviewed with patient? Yes  SHORT TERM GOALS: (target date for Short term goals are 3 weeks 11/12/2023)  1. Patient will demonstrate independent use of home exercise program to maintain progress from in clinic treatments.  Goal status: MET 11/21/23  LONG TERM GOALS: (target dates for all long term goals are 10 weeks  12/17/2023 )   1. Patient will demonstrate/report pain at worst less than or equal to 2/10 to facilitate minimal limitation in daily activity secondary to pain symptoms.  Goal status: New   2. Patient will demonstrate independent use of home exercise program to facilitate ability to maintain/progress functional gains from skilled physical therapy services.  Goal status: New   3. Patient will demonstrate FOTO outcome > or = 77 % to indicate reduced disability due to condition.  Goal status: New   4. Patient will demonstrate lumbar extension >/= 30 degrees WFL s symptoms to facilitate upright standing, walking posture at PLOF s limitation.  Goal status: New   5.  Pt will be able to report sitting for 30 minutes with pain </= 2/10 down her left LE and buttocks.   Goal status: New   6.  Pt will improve her Left hip strength to 5/5 for improved functional mobility.  Goal status: New     PLAN:  PT FREQUENCY: 1-2x/week  PT DURATION: 8 weeks  PLANNED INTERVENTIONS: Can include 16109- PT Re-evaluation, 97110-Therapeutic exercises, 97530- Therapeutic activity, [id number]- Neuromuscular re-education, 97535- Self Care, 97140- Manual therapy, [phone]- Gait training, [phone]- Orthotic Fit/training, [phone]- Canalith repositioning, [id number]- Aquatic Therapy, 97014- Electrical stimulation (unattended), 97750 Physical performance testing, [id number]- Electrical stimulation (manual), 97016- Vasopneumatic device, [id number]- Ultrasound, [id number]- Traction (mechanical), [id number]- Ionotophoresis 4mg /ml Dexamethasone, Patient/Family  education, Balance training, Stair training, Taping, Dry Needling, Joint mobilization, Joint manipulation, Spinal manipulation, Spinal mobilization, Scar mobilization, Vestibular training, Visual/preceptual remediation/compensation, DME instructions, Cryotherapy, and Moist heat.  All performed as medically necessary.  All included unless contraindicated  PLAN FOR NEXT SESSION: Assess response to hamstring DN; repeat PRN; hamstring strengthening (trial deadlifts) Core strengthening (pt with diastasis recti )   Next MD Visit: 01/18/24    Clarita Crane, PT, DPT 11/21/23 2:24 PM

## 2023-11-22 ENCOUNTER — Encounter: Payer: PRIVATE HEALTH INSURANCE | Admitting: Physical Therapy

## 2023-11-27 ENCOUNTER — Encounter: Payer: PRIVATE HEALTH INSURANCE | Admitting: Physical Therapy

## 2023-11-29 ENCOUNTER — Encounter: Payer: Self-pay | Admitting: Physical Therapy

## 2023-11-29 ENCOUNTER — Ambulatory Visit (INDEPENDENT_AMBULATORY_CARE_PROVIDER_SITE_OTHER): Payer: PRIVATE HEALTH INSURANCE | Admitting: Physical Therapy

## 2023-11-29 DIAGNOSIS — M5442 Lumbago with sciatica, left side: Secondary | ICD-10-CM

## 2023-11-29 DIAGNOSIS — G8929 Other chronic pain: Secondary | ICD-10-CM

## 2023-11-29 DIAGNOSIS — M6281 Muscle weakness (generalized): Secondary | ICD-10-CM

## 2023-11-29 NOTE — Therapy (Signed)
 OUTPATIENT PHYSICAL THERAPY TREATMENT   Patient Name: Jaedin Trumbo MRN: [id number] DOB:[birth year], 33 y.o., female Today's Date: 11/29/2023  END OF SESSION:  PT End of Session - 11/29/23 1511     Visit Number 5    Number of Visits 16    Date for PT Re-Evaluation 12/17/23    PT Start Time 1510    PT Stop Time 1549    PT Time Calculation (min) 39 min    Activity Tolerance Patient tolerated treatment well    Behavior During Therapy WFL for tasks assessed/performed                 Past Medical History:  Diagnosis Date   Preeclampsia    UTI (lower urinary tract infection)    Past Surgical History:  Procedure Laterality Date   CESAREAN SECTION N/A 04/26/2013   Procedure: Primary cesarean section  with delivery of twin  A girl at 43. Twin B girl at 39.;  Surgeon: Zelphia Cairo, MD;  Location: [facility];  Service: Obstetrics;  Laterality: N/A;   Patient Active Problem List   Diagnosis Date Noted   Somatic dysfunction of spine, sacral 02/05/2023   Left sided sciatica 11/20/2022   Irregular menses 06/12/2019   Underweight 06/12/2019   Family history of thyroid disease in mother 06/12/2019   Environmental and seasonal allergies 06/12/2019   Hypertension in pregnancy, postpartum condition 09/22/2013   Disorder of skin and subcutaneous tissue 07/10/2013   Status post primary low transverse cesarean section 05/01/2013    Class: Status post   Twin pregnancy delivered 05/01/2013    Class: Status post   Twin pregnancy, twins discordant in third trimester 05/01/2013    Class: Present on Admission   Monochorionic diamniotic twin pregnancy 02/13/2013    PCP: Park Meo, FNP   REFERRING PROVIDER: Teryl Lucy, MD   REFERRING DIAG:  Free Text Diagnosis  L Lumbar Radic    Rationale for Evaluation and Treatment: Rehabilitation  THERAPY DIAG:  Chronic left-sided low back pain with left-sided sciatica  Muscle weakness (generalized)  ONSET DATE: January  2024  SUBJECTIVE:  SUBJECTIVE STATEMENT: Symptoms are about the same; sees neurosurgeon next week   PERTINENT HISTORY:  Preeclampsia, UTI, abdominal diastasis recti from having twins 10 years ago  PAIN:  NPRS scale: 0 /10 pain at rest, in last week 6/10 Pain location: left buttock to back of left knee, can extend to toes Pain description: achy, dull  Aggravating factors: sitting Relieving factors: standing, walking  PRECAUTIONS: None  WEIGHT BEARING RESTRICTIONS: No  FALLS:  Has patient fallen in last 6 months? No  LIVING ENVIRONMENT: Lives with: lives with their family and lives with their spouse Lives in: House/apartment Stairs: Yes: Internal: 1 flight steps; left and then right Has following equipment at home: None  OCCUPATION: nurse   PLOF: Independent  PATIENT GOALS: sit without pain   OBJECTIVE:   DIAGNOSTIC FINDINGS: 09/07/23 Limited musculoskeletal ultrasound of the left posterior hip and buttock  was performed today.  No cortical irregularity of the ischial tuberosity.   The sciatic nerve was identified both superior and inferior to the  piriformis musculature.  The piriformis was seen both static and dynamic  views with out tendinopathy.  The sciatic nerve did set just superior to  the piriformis.  The left sciatic nerve is slightly larger in diameter  compared to the contralateral right side.   PATIENT SURVEYS:  10/22/23: FOTO eval:    73%   SCREENING FOR RED FLAGS: Bowel or bladder incontinence: No Cauda equina syndrome: No  COGNITION: Overall cognitive status: WFL  normal      SENSATION: WFL  MUSCLE LENGTH: Hamstrings: Right 72 deg; Left 80 deg   POSTURE:  forward head  PALPATION: 10/22/23 TTP: left QL, left glute medius, left piriformis, Rt piriformis  LUMBAR ROM:   Directional Preference Assessment: extension   AROM eval  Flexion 85 (finger tips to floor)  Extension 18  Right lateral flexion 35  Left lateral flexion 40  Right rotation WFL  Left rotation WFL   (Blank rows = not tested)  LOWER EXTREMITY ROM:      Right 10/22/23 Left 10/22/23  Hip flexion 120 120  Hip extension    Hip abduction    Hip adduction    Hip internal rotation    Hip external rotation    Knee flexion    Knee extension    Ankle dorsiflexion    Ankle plantarflexion    Ankle inversion    Ankle eversion     (Blank rows = not tested)  LOWER EXTREMITY MMT:    MMT Right eval Left eval  Hip flexion 5 4  Hip extension 5 4  Hip abduction 5 4  Hip adduction 5 4   (Blank rows = not tested)  LUMBAR SPECIAL TESTS:  Slump test: Negative  GAIT: [facility]  TODAY'S TREATMENT DATE:  11/29/23 Manual STM with compression to bil glutes, piriformis and proximal hamstrings; skilled palpation and monitoring of soft tissue during DN  Trigger Point Dry Needling  Subsequent Treatment: Instructions provided previously at initial dry needling treatment.   Patient Verbal Consent Given: Yes Education Handout Provided: Previously Provided Muscles Treated: bil piriformis, Lt proximal and middle hamstring; Lt pelvic floor (obturator internus) Electrical Stimulation Performed: No Treatment Response/Outcome: twitch responses noted   11/21/23 TherEx Trial of high hamstring stretch with minimal stretch felt Updated HEP and added SI stabilization exercises -  discussed exercises and how to perform  Manual STM with compression to bil glutes, piriformis and proximal hamstrings; skilled palpation and monitoring of soft tissue during DN  Trigger Point Dry Needling  Subsequent Treatment: Instructions provided previously at initial dry needling treatment.   Patient Verbal Consent Given: Yes Education Handout Provided: Previously Provided Muscles Treated: bil piriformis, proximal hamstring Electrical Stimulation Performed: No Treatment Response/Outcome: twitch responses noted; decreased pain with squat following  11/15/23 TherEx Review of sciatic nerve slides/glides segmentals on Lt hip, knee and ankle 5x5 sec hold each Bridge with isometric hip abduction x 5 reps Discussed diastasis and core modifications if doming present with home workouts; pt verbalized understanding  Manual STM with compression to bil glutes and piriformis; skilled palpation and monitoring of soft tissue during DN; trigger points noted in piriformis and glute max; tenderness at ischial tuberosity  Trigger Point Dry Needling  Subsequent Treatment: Instructions provided previously at initial dry needling treatment.   Patient Verbal Consent Given: Yes Education Handout Provided: Previously Provided Muscles Treated: bil glute min, piriformis Electrical Stimulation Performed: No Treatment Response/Outcome: twitch responses noted     11/07/23 TherEx Sciatic nerve slides/glides segmentals on Lt hip, knee and ankle 5x5 sec hold each Review of HEP with modifications to piriformis stretch LLD assessment with limbs equal length  Manual STM with compression to Lt glutes and piriformis; skilled palpation and monitoring of soft tissue during DN; trigger points noted in piriformis and glute max; tenderness at ischial tuberosity  Trigger Point Dry Needling  Initial Treatment: Pt instructed on Dry Needling rational, procedures, and possible side effects. Pt instructed to  expect mild to moderate muscle soreness later in the day and/or into the next day.  Pt instructed in methods to reduce muscle soreness. Pt instructed to continue prescribed HEP. Patient was educated on signs and symptoms of infection and other risk factors and advised to seek medical attention should they occur.  Patient verbalized understanding of these instructions and education.   Patient Verbal Consent Given: Yes Education Handout Provided: Yes Muscles Treated: Lt piriformis, glute max Electrical Stimulation Performed: No Treatment Response/Outcome: twitch responses noted  Self Care Educated on goal of extension based exercises; plan of care and activity modifications PRN  10/22/23  Therex: HEP instruction/performance c cues for techniques, handout provided.  Trial set performed of each for comprehension and symptom assessment.  See below for exercise list Self Care:  Pt edu in Sitting posture and transfer mechanics  PATIENT EDUCATION:  Education details: HEP, POC Person educated: Patient Education method: Explanation, Demonstration, Verbal cues, and Handouts Education comprehension: verbalized understanding, returned demonstration, and verbal cues required  HOME EXERCISE PROGRAM: Access Code: [email or web address] URL: [email or web address] Date: 11/21/2023 Prepared by: Moshe Cipro  Exercises - Supine Cervical Retraction with Towel  - 1 x daily - 7 x weekly - 10 reps - 5 seconds hold - Supine Bridge  - 2 x daily - 7 x weekly -  2 sets - 10 reps - 5 seconds hold - Supine Lower Trunk Rotation  - 2 x daily - 7 x weekly - 3 reps - 20 seconds hold - Hip Neural Slide/Glide  - 2 x daily - 7 x weekly - 1 sets - 10 reps - 5-10 sec hold - Knee Nerve Slide/Glide   - 1 x daily - 7 x weekly - 3 sets - 10 reps - Supine Bridge with Resistance Band  - 1 x daily - 7 x weekly - 1 sets - 10 reps - 5 seconds hold - Hooklying Isometric Hip Abduction with Belt  - 1 x daily - 7 x weekly -  1 sets - 10 reps - 5 sec hold  ASSESSMENT:  CLINICAL IMPRESSION: Pt with improvement in symptoms with DN and manual therapy but still having continued symptoms at this time.  She is scheduled to see neurosurgery next week so will see what he says at that time.  Continue skilled PT.    OBJECTIVE IMPAIRMENTS: decreased ROM, decreased strength, pain   ACTIVITY LIMITATIONS: sitting pain   PARTICIPATION LIMITATIONS: community and work related activities    PERSONAL FACTORS: 1-2 comorbidities: see pertinent history  are also affecting patient's functional outcome.   REHAB POTENTIAL: Good  CLINICAL DECISION MAKING: Stable/uncomplicated  EVALUATION COMPLEXITY: Low   GOALS: Goals reviewed with patient? Yes  SHORT TERM GOALS: (target date for Short term goals are 3 weeks 11/12/2023)  1. Patient will demonstrate independent use of home exercise program to maintain progress from in clinic treatments.  Goal status: MET 11/21/23  LONG TERM GOALS: (target dates for all long term goals are 10 weeks  12/17/2023 )   1. Patient will demonstrate/report pain at worst less than or equal to 2/10 to facilitate minimal limitation in daily activity secondary to pain symptoms.  Goal status: New   2. Patient will demonstrate independent use of home exercise program to facilitate ability to maintain/progress functional gains from skilled physical therapy services.  Goal status: New   3. Patient will demonstrate FOTO outcome > or = 77 % to indicate reduced disability due to condition.  Goal status: New   4. Patient will demonstrate lumbar extension >/= 30 degrees WFL s symptoms to facilitate upright standing, walking posture at PLOF s limitation.  Goal status: New   5.  Pt will be able to report sitting for 30 minutes with pain </= 2/10 down her left LE and buttocks.   Goal status: New   6.  Pt will improve her Left hip strength to 5/5 for improved functional mobility.  Goal status: New      PLAN:  PT FREQUENCY: 1-2x/week  PT DURATION: 8 weeks  PLANNED INTERVENTIONS: Can include 45409- PT Re-evaluation, 97110-Therapeutic exercises, 97530- Therapeutic activity, [id number]- Neuromuscular re-education, 97535- Self Care, 97140- Manual therapy, [phone]- Gait training, [phone]- Orthotic Fit/training, [phone]- Canalith repositioning, [id number]- Aquatic Therapy, 97014- Electrical stimulation (unattended), 97750 Physical performance testing, [id number]- Electrical stimulation (manual), 97016- Vasopneumatic device, [id number]- Ultrasound, [id number]- Traction (mechanical), [id number]- Ionotophoresis 4mg /ml Dexamethasone, Patient/Family education, Balance training, Stair training, Taping, Dry Needling, Joint mobilization, Joint manipulation, Spinal manipulation, Spinal mobilization, Scar mobilization, Vestibular training, Visual/preceptual remediation/compensation, DME instructions, Cryotherapy, and Moist heat.  All performed as medically necessary.  All included unless contraindicated  PLAN FOR NEXT SESSION: what did NS say; repeat PRN; hamstring strengthening (trial deadlifts) Core strengthening (pt with diastasis recti )   Next MD Visit: 01/18/24    Clarita Crane, PT, DPT 11/29/23 3:53 PM

## 2023-11-30 ENCOUNTER — Other Ambulatory Visit: Payer: Self-pay | Admitting: Sports Medicine

## 2023-11-30 DIAGNOSIS — G5702 Lesion of sciatic nerve, left lower limb: Secondary | ICD-10-CM

## 2023-11-30 DIAGNOSIS — G5782 Other specified mononeuropathies of left lower limb: Secondary | ICD-10-CM

## 2023-11-30 DIAGNOSIS — G8929 Other chronic pain: Secondary | ICD-10-CM

## 2023-12-03 NOTE — Progress Notes (Unsigned)
 Referring Physician:  Park Meo, FNP [address] [place],  [place] [address]  Primary Physician:  Park Meo, FNP  History of Present Illness: 12/05/2023 Terry Paul is here today with a chief complaint of left-sided buttocks pain.  She had extensive workup and had diagnoses including piriformis syndrome, sciatic neuropathy, nerve pain.  She has had Hydro dissections, multiple trigger point injections, EMGs, neurology consults but has not yet had a full diagnosis.  She continues to have pain.  She is here today for further evaluation.  And putting pressure on her buttocks.  She states that she does have some discomfort with certain pieces of clothing  No back pain, but she does have pain in the left buttock that goes down the left leg. She does complain of some heaviness in the leg.    Conservative measures: EMG on 06/21/23, she has also had dry needling Physical therapy: currently participating in at [facility], initial evaluation on 10/22/23, has helped some but pain is still there when she sits Multimodal medical therapy including regular antiinflammatories: lyrica, tizanidine, ibuprofen,  Injections:steroid injections by Dr. Antoine Primas   3 Ultrasound-guided sciatic nerve hydrodissection  Injections by Dr. Shon Baton    Past Surgery: no spinal surgeries   The symptoms are causing a significant impact on the patient's life.   I have utilized the care everywhere function in epic to review the outside records available from external health systems.  Review of Systems:  A 10 point review of systems is negative, except for the pertinent positives and negatives detailed in the HPI.  Past Medical History: Past Medical History:  Diagnosis Date   Preeclampsia    UTI (lower urinary tract infection)     Past Surgical History: Past Surgical History:  Procedure Laterality Date   CESAREAN SECTION N/A 04/26/2013   Procedure: Primary cesarean section  with  delivery of twin  A girl at 59. Twin B girl at 65.;  Surgeon: Zelphia Cairo, MD;  Location: [facility];  Service: Obstetrics;  Laterality: N/A;    Allergies: Allergies as of 12/05/2023   (No Known Allergies)    Medications:  Current Outpatient Medications:    ibuprofen (ADVIL) 200 MG tablet, Take 200 mg by mouth every 6 (six) hours as needed., Disp: , Rfl:    meloxicam (MOBIC) 15 MG tablet, Take 15 mg by mouth daily., Disp: , Rfl:    pregabalin (LYRICA) 75 MG capsule, TAKE 1 CAPSULE BY MOUTH 2 TIMES A DAY, Disp: 60 capsule, Rfl: 1   tiZANidine (ZANAFLEX) 4 MG tablet, TAKE 1 TABLET BY MOUTH EVERY DAY NIGHTLY (Patient taking differently: Take 1/2 tablet), Disp: 30 tablet, Rfl: 2  Social History: Social History   Tobacco Use   Smoking status: Never   Smokeless tobacco: Never  Vaping Use   Vaping status: Never Used  Substance Use Topics   Alcohol use: Never   Drug use: No    Family Medical History: Family History  Problem Relation Age of Onset   Healthy Mother    Healthy Father     Physical Examination: Vitals:   12/05/23 1012  BP: 130/88    General: Patient is in no apparent distress. Attention to examination is appropriate.  Neck:   Supple.  Full range of motion.  Respiratory: Patient is breathing without any difficulty.   NEUROLOGICAL:     Awake, alert, oriented to person, place, and time.  Speech is clear and fluent.   Cranial  Nerves: Pupils equal round and reactive to light.  Facial tone is symmetric.  Facial sensation is symmetric. Shoulder shrug is symmetric. Tongue protrusion is midline.    Strength: She does not show any major motor deficits in her lower extremity including no weakness in her sciatic distribution musculature.  Unable to clearly find a point of a Tinel, however in the region of the medial cluneal and inferior cluneal nerves she did get some slight radiation to the more lateral aspect of her buttocks.  She had no significant pain at the  ischial tuberosity but did have some discomfort as well slightly lateral.     Imaging: Narrative & Impression  CLINICAL DATA:  Left buttock and leg pain for 8 months, no known injury   EXAM: MRI PELVIS WITHOUT AND WITH CONTRAST   TECHNIQUE: Multiplanar multisequence MR imaging of the pelvis was performed both before and after administration of intravenous contrast.   CONTRAST:  3.5 mL Vueway gadolinium contrast IV   COMPARISON:  None Available.   FINDINGS: Urinary Tract:  No abnormality visualized.   Bowel:  Unremarkable visualized pelvic bowel loops.   Vascular/Lymphatic: No pathologically enlarged lymph nodes. No significant vascular abnormality seen.   Reproductive: No mass. C-section scar of the lower anterior uterine segment. Numerous bilateral ovarian follicles, benign, requiring no further follow-up or characterization. Prominent bilateral ovarian and adnexal varices (series 9, image 17).   Other:  Small volume free fluid in the low pelvis.   Musculoskeletal: No suspicious bone lesions identified. Mild pubic symphysis arthrosis (series 6, image 28).   IMPRESSION: 1. No acute MR findings of the pelvis to explain pain. 2. Prominent bilateral ovarian and adnexal varices, which can be seen in the setting of pelvic congestion if referable signs and symptoms are present. 3. Small volume free fluid in the low pelvis, likely functional in the reproductive age setting. 4. Mild pubic symphysis arthrosis.     Electronically Signed   By: Jearld Lesch M.D.   On: 05/15/2023 11:01     Study Result  Narrative & Impression    [facility] [address] [place], [place] [address] [phone]   NEUROIMAGING REPORT     STUDY DATE: 01/21/2023 PATIENT NAME: Terry Paul DOB: 04/12/1991 MRN: [id number]   EXAM: MRI of the cervical spine   ORDERING CLINICIAN: Richard A. Epimenio Foot, MD. PhD CLINICAL HISTORY: 33 year old woman with left leg  numbness.  Assess for demyelination COMPARISON FILMS: None   TECHNIQUE: MRI of the cervical spine was obtained utilizing 3 mm sagittal slices from the posterior fossa down to the T3-4 level with T1, T2 and inversion recovery views. In addition 4 mm axial slices from C2-3 down to T1-2 level were included with T2 and gradient echo views. CONTRAST: None IMAGING SITE:  imaging, [address] [place], [place], [place]   FINDINGS: :  On sagittal images, the spine is imaged from above the cervicomedullary junction to T2.   The visible brain and the cervicomedullary junction appears normal.  Paravertebral soft tissue appears normal.  The spinal cord is of normal caliber and signal.   The vertebral bodies are normally aligned.   The vertebral bodies have normal signal.     The discs and interspaces were further evaluated on axial views from C2 to T1.  At C5-C6 there is a small central disc bulge.  There is no spinal stenosis, foraminal narrowing or nerve root compression       IMPRESSION: This MRI of the cervical spine without  contrast shows the following: The spinal cord appears normal. Small disc bulges C5-C6 but no spinal stenosis or nerve root compression.       INTERPRETING PHYSICIAN:  Richard A. Epimenio Foot, MD, PhD, FAAN Certified in  Neuroimaging by American Society of Neuroimaging           Full Name:      Xcaret Morad    Gender:          Female MRN #:            [id number]      Date of Birth: 10-25-1990    Visit Date:      06/21/2023 09:11 Age:    79 Years Examining Physician:          Dr. Naomie Dean Referring Physician: Dr. Despina Arias Height:            5 feet 0 inch    History: Emerly Prak is a 33 y.o. female with left leg and foot numbness, left-sided sciatica. Right leg is starting to have similar issues but not as significant as the left leg,   Summary: EMG/Nerve Conduction Studies were performed on the bilateral lower extremities. All nerves and muscles (as indicated in  the following tables) were within normal limits.     Conclusion: This is a normal study of the bilateral lower extremities.  No evidence for mononeuropathy, polyneuropathy, lumbar radiculopathy or muscle disease.     ------------------------------- Naomie Dean, M.D.   [facility] [address], [address] [place], [place] [address] Tel: [phone] Fax: [phone]   Verbal informed consent was obtained from the patient, patient was informed of potential risk of procedure, including bruising, bleeding, hematoma formation, infection, muscle weakness, muscle pain, numbness, among others.     I have personally reviewed the images and agree with the above interpretation.  Medical Decision Making/Assessment and Plan: Ms. Nofsinger is a pleasant 33 y.o. female with over a year of left-sided buttocks pain.  She has been dealing with this for significant amount of time, sometimes it radiates down her legs, most of the time however does not radiate past her knee.  She states that a majority of her pain is in her left buttocks and worsens with sitting.  She does not notice any other certain activities that make it worse.  Her physical examination shows good strength with no clear muscle wasting.  Her EMG and nerve conduction study demonstrated no clear radiculopathy or mononeuropathy sensory nerve conduction specifically was within normal limits.  She has had hydrodissection for possible sciatic neuropathy had multiple other injections.  Continues to have pain and discomfort.  She notices this is worse when she is sitting.  On physical examination she does have some radiation in the location of the inferior cluneal and medial cluneal nerves.  She also has some pain to palpation with a positive Fortin sign on her left sided SI joint.  At this point her pain could be from a multitude of etiologies.  I would like her to undergo injections of her inferior cluneal nerve first, should that not  give her any significant relief then consider a medial cluneal injection.  I do not think her distribution is consistent with a superior clunea dermatome.  Is not uncommon for cluneal neuropathies to radiate down the leg and somewhat mimic lumbar plexopathy's or lumbar radiculopathies.  In regards to the SI joint pain, this can also sometimes cause radiation down the leg however I think this is lower on the  differential.  She does have a history of a previous pregnancy with twins.  Has had a previous C-section but no spinal or buttocks level surgery.  Would like to get lumbar flexion-extension x-rays to evaluate for any spondylolisthesis which could be causing pain in this distribution.  I would like to follow-up with her after her injections.  Thank you for involving me in the care of this patient.    Lovenia Kim MD/MSCR Neurosurgery   Spent a total 45 minutes, reviewing her care, going over her outside records including neurology records, physiatry records, neurology results from EMG, her imaging and her therapy notes.  We spent time on face to his evaluation as well as coordination of her care going forward.

## 2023-12-04 ENCOUNTER — Encounter: Payer: PRIVATE HEALTH INSURANCE | Admitting: Physical Therapy

## 2023-12-05 ENCOUNTER — Ambulatory Visit (INDEPENDENT_AMBULATORY_CARE_PROVIDER_SITE_OTHER): Payer: PRIVATE HEALTH INSURANCE | Admitting: Neurosurgery

## 2023-12-05 ENCOUNTER — Encounter: Payer: Self-pay | Admitting: Neurosurgery

## 2023-12-05 ENCOUNTER — Ambulatory Visit
Admission: RE | Admit: 2023-12-05 | Discharge: 2023-12-05 | Disposition: A | Discharge status: Home / Self Care | Payer: PRIVATE HEALTH INSURANCE | Source: Ambulatory Visit | Attending: Neurosurgery | Admitting: Neurosurgery

## 2023-12-05 VITALS — BP 130/88 | Ht 60.0 in | Wt 89.8 lb

## 2023-12-05 DIAGNOSIS — M5116 Intervertebral disc disorders with radiculopathy, lumbar region: Secondary | ICD-10-CM

## 2023-12-05 DIAGNOSIS — M7918 Myalgia, other site: Secondary | ICD-10-CM | POA: Diagnosis not present

## 2023-12-06 ENCOUNTER — Encounter: Payer: Self-pay | Admitting: Physical Therapy

## 2023-12-06 ENCOUNTER — Ambulatory Visit (INDEPENDENT_AMBULATORY_CARE_PROVIDER_SITE_OTHER): Payer: PRIVATE HEALTH INSURANCE | Admitting: Physical Therapy

## 2023-12-06 DIAGNOSIS — M6281 Muscle weakness (generalized): Secondary | ICD-10-CM

## 2023-12-06 DIAGNOSIS — G8929 Other chronic pain: Secondary | ICD-10-CM | POA: Diagnosis not present

## 2023-12-06 DIAGNOSIS — M5442 Lumbago with sciatica, left side: Secondary | ICD-10-CM | POA: Diagnosis not present

## 2023-12-06 NOTE — Therapy (Addendum)
 OUTPATIENT PHYSICAL THERAPY TREATMENT DISCHARGE SUMMARY   Patient Name: Terry Paul MRN: [id number] DOB:07-17-1991, 33 y.o., female Today's Date: 12/06/2023  END OF SESSION:  PT End of Session - 12/06/23 1509     Visit Number 6    Number of Visits 16    Date for PT Re-Evaluation 12/17/23    PT Start Time 1510    PT Stop Time 1540    PT Time Calculation (min) 30 min    Activity Tolerance Patient tolerated treatment well    Behavior During Therapy WFL for tasks assessed/performed                  Past Medical History:  Diagnosis Date   Preeclampsia    UTI (lower urinary tract infection)    Past Surgical History:  Procedure Laterality Date   CESAREAN SECTION N/A 04/26/2013   Procedure: Primary cesarean section  with delivery of twin  A girl at 14. 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 12/05/2023   Gluteal pain 12/05/2023   Somatic dysfunction of spine, sacral 02/05/2023   Left sided sciatica 11/20/2022   Irregular menses 06/12/2019   Underweight 06/12/2019   Family history of thyroid disease in mother 06/12/2019   Environmental and seasonal allergies 06/12/2019   Hypertension in pregnancy, postpartum condition 09/22/2013   Disorder of skin and subcutaneous tissue 07/10/2013   Status post primary low transverse cesarean section 05/01/2013    Class: Status post   Twin pregnancy delivered 05/01/2013    Class: Status post   Twin pregnancy, twins discordant in third trimester 05/01/2013    Class: Present on Admission   Monochorionic diamniotic twin pregnancy 02/13/2013    PCP: Jenelle Mis, FNP   REFERRING PROVIDER: Osa Blase, MD   REFERRING DIAG:  Free Text Diagnosis  L Lumbar Radic    Rationale for Evaluation and Treatment: Rehabilitation  THERAPY  DIAG:  Chronic left-sided low back pain with left-sided sciatica  Muscle weakness (generalized)  ONSET DATE: January 2024  SUBJECTIVE:  SUBJECTIVE STATEMENT: Sessions provide short term benefit; still not seeing much long term change.   PERTINENT HISTORY:  Preeclampsia, UTI, abdominal diastasis recti from having twins 10 years ago  PAIN:  NPRS scale: 0 /10 pain at rest, in last week 6/10 Pain location: left buttock to back of left knee, can extend to toes Pain description: achy, dull  Aggravating factors: sitting Relieving factors: standing, walking  PRECAUTIONS: None  WEIGHT BEARING RESTRICTIONS: No  FALLS:  Has patient fallen in last 6 months? No  LIVING ENVIRONMENT: Lives with: lives with their family and lives with their spouse Lives in: House/apartment Stairs: Yes: Internal: 1 flight steps; left and then right Has following equipment at home: None  OCCUPATION: nurse   PLOF: Independent  PATIENT GOALS: sit without pain   OBJECTIVE:   DIAGNOSTIC FINDINGS: 09/07/23 Limited musculoskeletal ultrasound of the left posterior hip and buttock  was performed today.  No cortical irregularity of the ischial tuberosity.   The sciatic nerve was identified both superior and inferior to the  piriformis musculature.  The piriformis was seen both static and dynamic  views with out tendinopathy.  The sciatic nerve did set just superior to  the piriformis.  The left sciatic nerve is slightly larger in diameter  compared to the contralateral right side.   PATIENT SURVEYS:  10/22/23: FOTO eval:    73%   SCREENING FOR  RED FLAGS: Bowel or bladder incontinence: No Cauda equina syndrome: No  COGNITION: Overall cognitive status: WFL normal      SENSATION: WFL  MUSCLE LENGTH: Hamstrings: Right 72 deg; Left 80 deg   POSTURE:  forward head  PALPATION: 10/22/23 TTP: left QL, left glute medius, left piriformis, Rt piriformis  LUMBAR ROM:   Directional Preference Assessment: extension   AROM eval  Flexion 85 (finger tips to floor)  Extension 18  Right lateral flexion 35  Left lateral flexion 40  Right rotation WFL  Left rotation WFL   (Blank rows = not tested)  LOWER EXTREMITY ROM:      Right 10/22/23 Left 10/22/23  Hip flexion 120 120  Hip extension    Hip abduction    Hip adduction    Hip internal rotation    Hip external rotation    Knee flexion    Knee extension    Ankle dorsiflexion    Ankle plantarflexion    Ankle inversion    Ankle eversion     (Blank rows = not tested)  LOWER EXTREMITY MMT:    MMT Right eval Left eval  Hip flexion 5 4  Hip extension 5 4  Hip abduction 5 4  Hip adduction 5 4   (Blank rows = not tested)  LUMBAR SPECIAL TESTS:  Slump test: Negative  GAIT: [facility]  TODAY'S TREATMENT DATE:  12/06/23 Manual STM with compression to bil glutes and piriformis; skilled palpation and monitoring of soft tissue during DN  Trigger Point Dry Needling  Subsequent Treatment: Instructions provided previously at initial dry needling treatment.   Patient Verbal Consent Given: Yes Education Handout Provided: Previously Provided Muscles Treated: bil glute max Electrical Stimulation Performed: No Treatment Response/Outcome: twitch responses noted   Self Care Discussed clinical assessment from neurosurgeon, and likely plan for injection.  Plan to hold PT at  this time until after injection.  TherEx Demonstration with trial reps of deadlifts with 10# KB, min cues for technique; discussed benefit for hamstring stretching and strengthening and to trial these as able   11/29/23 Manual STM with compression to bil glutes, piriformis and proximal hamstrings; skilled palpation and monitoring of soft tissue during DN  Trigger Point Dry Needling  Subsequent Treatment: Instructions provided previously at initial dry needling treatment.   Patient Verbal Consent Given: Yes Education Handout Provided: Previously Provided Muscles Treated: bil piriformis, Lt proximal and middle hamstring; Lt pelvic floor (obturator internus) Electrical Stimulation Performed: No Treatment Response/Outcome: twitch responses noted   11/21/23 TherEx Trial of high hamstring stretch with minimal stretch felt Updated HEP and added SI stabilization exercises - discussed exercises and how to perform  Manual STM with compression to bil glutes, piriformis and proximal hamstrings; skilled palpation and monitoring of soft tissue during DN  Trigger Point Dry Needling  Subsequent Treatment: Instructions provided previously at initial dry needling treatment.   Patient Verbal Consent Given: Yes Education Handout Provided: Previously Provided Muscles Treated: bil piriformis, proximal hamstring Electrical Stimulation Performed: No Treatment Response/Outcome: twitch responses noted; decreased pain with squat following  11/15/23 TherEx Review of sciatic nerve slides/glides segmentals on Lt hip, knee and ankle 5x5 sec hold each Bridge with isometric hip abduction x 5 reps Discussed diastasis and core modifications if doming present with home workouts; pt verbalized understanding  Manual STM with compression to bil glutes and piriformis; skilled palpation and monitoring of soft tissue during DN; trigger points noted in piriformis and glute max; tenderness at ischial  tuberosity  Trigger Point Dry Needling  Subsequent Treatment: Instructions provided previously at initial dry needling treatment.   Patient Verbal Consent Given: Yes Education Handout Provided: Previously Provided Muscles Treated: bil glute min, piriformis Electrical Stimulation Performed: No Treatment Response/Outcome: twitch responses noted    PATIENT EDUCATION:  Education details: HEP, POC Person educated: Patient Education method: Programmer, multimedia, Facilities manager, Verbal cues, and Handouts Education comprehension: verbalized understanding, returned demonstration, and verbal cues required  HOME EXERCISE PROGRAM: Access Code: [email or web address] URL: [email or web address] Date: 12/06/2023 Prepared by: Casimer Clear  Exercises - Supine Cervical Retraction with Towel  - 1 x daily - 7 x weekly - 10 reps - 5 seconds hold - Supine Bridge  - 2 x daily - 7 x weekly - 2 sets - 10 reps - 5 seconds hold - Supine Lower Trunk Rotation  - 2 x daily - 7 x weekly - 3 reps - 20 seconds hold - Hip Neural Slide/Glide  - 2 x daily - 7 x weekly - 1 sets - 10 reps - 5-10 sec hold - Knee Nerve Slide/Glide   - 1 x daily - 7 x weekly - 3 sets - 10 reps - Supine Bridge with Resistance Band  - 1 x daily - 7 x weekly - 1 sets - 10 reps - 5 seconds hold - Hooklying Isometric Hip Abduction with Belt  - 1 x daily - 7  x weekly - 1 sets - 10 reps - 5 sec hold - Kettlebell Deadlift  - 1 x daily - 7 x weekly - 3 sets - 10 reps  ASSESSMENT:  CLINICAL IMPRESSION: Pt tolerated session well today, plan to hold until after injection as PT has demonstrated little functional change and only provided some short term relief.  Will hold PT.  OBJECTIVE IMPAIRMENTS: decreased ROM, decreased strength, pain   ACTIVITY LIMITATIONS: sitting pain   PARTICIPATION LIMITATIONS: community and work related activities    PERSONAL FACTORS: 1-2 comorbidities: see pertinent history  are also affecting patient's functional  outcome.   REHAB POTENTIAL: Good  CLINICAL DECISION MAKING: Stable/uncomplicated  EVALUATION COMPLEXITY: Low   GOALS: Goals reviewed with patient? Yes  SHORT TERM GOALS: (target date for Short term goals are 3 weeks 11/12/2023)  1. Patient will demonstrate independent use of home exercise program to maintain progress from in clinic treatments.  Goal status: MET 11/21/23  LONG TERM GOALS: (target dates for all long term goals are 10 weeks  12/17/2023 )   1. Patient will demonstrate/report pain at worst less than or equal to 2/10 to facilitate minimal limitation in daily activity secondary to pain symptoms.  Goal status: ONGOING 12/06/23   2. Patient will demonstrate independent use of home exercise program to facilitate ability to maintain/progress functional gains from skilled physical therapy services.  Goal status: ONGOING 12/06/23   3. Patient will demonstrate FOTO outcome > or = 77 % to indicate reduced disability due to condition.  Goal status: ONGOING 12/06/23   4. Patient will demonstrate lumbar extension >/= 30 degrees WFL s symptoms to facilitate upright standing, walking posture at PLOF s limitation.  Goal status: ONGOING 12/06/23   5.  Pt will be able to report sitting for 30 minutes with pain </= 2/10 down her left LE and buttocks.   Goal status: ONGOING 12/06/23   6.  Pt will improve her Left hip strength to 5/5 for improved functional mobility.  Goal status: ONGOING 12/06/23     PLAN:  PT FREQUENCY: 1-2x/week  PT DURATION: 8 weeks  PLANNED INTERVENTIONS: Can include 14782- PT Re-evaluation, 97110-Therapeutic exercises, 97530- Therapeutic activity, 97112- Neuromuscular re-education, 97535- Self Care, 97140- Manual therapy, [phone]- Gait training, [phone]- Orthotic Fit/training, [phone]- Canalith repositioning, [id number]- Aquatic Therapy, 97014- Electrical stimulation (unattended), 97750 Physical performance testing, [id number]- Electrical stimulation (manual), [id number]-  Vasopneumatic device, [id number]- Ultrasound, [id number]- Traction (mechanical), [id number]- Ionotophoresis 4mg /ml Dexamethasone, Patient/Family education, Balance training, Stair training, Taping, Dry Needling, Joint mobilization, Joint manipulation, Spinal manipulation, Spinal mobilization, Scar mobilization, Vestibular training, Visual/preceptual remediation/compensation, DME instructions, Cryotherapy, and Moist heat.  All performed as medically necessary.  All included unless contraindicated  PLAN FOR NEXT SESSION: Hold PT at this time  Next MD Visit: 01/18/24    Marley Simmers, PT, DPT 12/06/23 3:51 PM    PHYSICAL THERAPY DISCHARGE SUMMARY  Visits from Start of Care: 6  Current functional level related to goals / functional outcomes: See above   Remaining deficits: See above   Education / Equipment: HEP, DN   Patient agrees to discharge. Patient goals were not met. Patient is being discharged due to did not respond to therapy.   Marley Simmers, PT, DPT 01/23/24 11:40 AM  [facility] [address] [place], [place], [address] Phone: [phone]   Fax:  [phone]

## 2023-12-10 ENCOUNTER — Encounter: Payer: Self-pay | Admitting: Neurosurgery

## 2023-12-10 DIAGNOSIS — M7918 Myalgia, other site: Secondary | ICD-10-CM

## 2023-12-10 DIAGNOSIS — M5116 Intervertebral disc disorders with radiculopathy, lumbar region: Secondary | ICD-10-CM

## 2023-12-11 ENCOUNTER — Encounter: Payer: PRIVATE HEALTH INSURANCE | Admitting: Physical Therapy

## 2023-12-13 NOTE — Telephone Encounter (Signed)
 Referral placed.

## 2023-12-19 ENCOUNTER — Encounter: Payer: PRIVATE HEALTH INSURANCE | Admitting: Physical Therapy

## 2023-12-25 ENCOUNTER — Ambulatory Visit (HOSPITAL_BASED_OUTPATIENT_CLINIC_OR_DEPARTMENT_OTHER): Payer: PRIVATE HEALTH INSURANCE | Admitting: Student in an Organized Health Care Education/Training Program

## 2023-12-25 DIAGNOSIS — G894 Chronic pain syndrome: Secondary | ICD-10-CM

## 2023-12-25 NOTE — Progress Notes (Signed)
 Issues with insurance, unclear if we are in network with patient's insurance. Staff looking into this, will reschedule her visit until in-network confirmation.

## 2024-01-09 ENCOUNTER — Encounter: Payer: PRIVATE HEALTH INSURANCE | Admitting: Family Medicine

## 2024-01-10 ENCOUNTER — Encounter: Payer: Self-pay | Admitting: Student in an Organized Health Care Education/Training Program

## 2024-01-10 ENCOUNTER — Ambulatory Visit
Payer: PRIVATE HEALTH INSURANCE | Attending: Student in an Organized Health Care Education/Training Program | Admitting: Student in an Organized Health Care Education/Training Program

## 2024-01-10 ENCOUNTER — Ambulatory Visit
Admission: RE | Admit: 2024-01-10 | Discharge: 2024-01-10 | Disposition: A | Discharge status: Home / Self Care | Payer: PRIVATE HEALTH INSURANCE | Source: Ambulatory Visit | Attending: Student in an Organized Health Care Education/Training Program | Admitting: Student in an Organized Health Care Education/Training Program

## 2024-01-10 VITALS — BP 136/96 | HR 92 | Temp 98.0°F | Resp 16 | Ht 60.0 in | Wt 90.0 lb

## 2024-01-10 DIAGNOSIS — M533 Sacrococcygeal disorders, not elsewhere classified: Secondary | ICD-10-CM | POA: Insufficient documentation

## 2024-01-10 DIAGNOSIS — G588 Other specified mononeuropathies: Secondary | ICD-10-CM | POA: Diagnosis not present

## 2024-01-10 DIAGNOSIS — G5702 Lesion of sciatic nerve, left lower limb: Secondary | ICD-10-CM | POA: Diagnosis not present

## 2024-01-10 NOTE — Patient Instructions (Signed)
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
## 2024-01-11 ENCOUNTER — Telehealth: Payer: Self-pay | Admitting: Student in an Organized Health Care Education/Training Program

## 2024-01-11 NOTE — Telephone Encounter (Signed)
 Patient states she called pre-service center and they told her they only give estimates for surgeries that we should be able to give her an estimate of cost for ordered procedure. She still hasn't been able to find anyone to tell her if insurance is in network. Who can find this info out for this patient. She needs to know if Dr Rhesa Celeste is in network and [facility]. Her insurance cannot tell her.

## 2024-01-14 ENCOUNTER — Telehealth: Payer: Self-pay | Admitting: Student in an Organized Health Care Education/Training Program

## 2024-01-14 NOTE — Telephone Encounter (Signed)
 Return in about 18 days (around 01/28/2024) for Left INFERIOR cluneal NB, in clinic NS.Check out comments:Schedule 1:30 pm Patient called requesting to schedule her appt for this. I offered her an appt on 5-12 but she wanted to move it out. 02-06-24 at 11am. Pre service told her they could not give her an estimate unless the procedure was scheduled.

## 2024-01-15 ENCOUNTER — Ambulatory Visit: Payer: PRIVATE HEALTH INSURANCE | Admitting: Family Medicine

## 2024-01-18 ENCOUNTER — Ambulatory Visit: Payer: PRIVATE HEALTH INSURANCE | Admitting: Sports Medicine

## 2024-01-22 ENCOUNTER — Other Ambulatory Visit: Payer: Self-pay | Admitting: Sports Medicine

## 2024-01-22 DIAGNOSIS — G5782 Other specified mononeuropathies of left lower limb: Secondary | ICD-10-CM

## 2024-01-22 DIAGNOSIS — G8929 Other chronic pain: Secondary | ICD-10-CM

## 2024-01-22 DIAGNOSIS — G5702 Lesion of sciatic nerve, left lower limb: Secondary | ICD-10-CM

## 2024-02-04 ENCOUNTER — Telehealth: Payer: Self-pay | Admitting: Student in an Organized Health Care Education/Training Program

## 2024-02-04 NOTE — Telephone Encounter (Signed)
 Patient wants to know if she can be billed for the hospital to her insurance and the physician billing as self pay. Her insurance says Dr Rhesa Celeste is out of network.

## 2024-02-06 ENCOUNTER — Telehealth: Payer: Self-pay | Admitting: *Deleted

## 2024-02-06 ENCOUNTER — Ambulatory Visit: Payer: PRIVATE HEALTH INSURANCE | Admitting: Student in an Organized Health Care Education/Training Program

## 2024-02-06 NOTE — Telephone Encounter (Signed)
 Left message stating her insurance is in network for both professional and facility. Left my number for her to call with any further questions.

## 2024-02-13 ENCOUNTER — Encounter: Payer: Self-pay | Admitting: Sports Medicine

## 2024-02-13 ENCOUNTER — Encounter: Payer: Self-pay | Admitting: Student in an Organized Health Care Education/Training Program

## 2024-02-13 ENCOUNTER — Ambulatory Visit
Payer: PRIVATE HEALTH INSURANCE | Attending: Student in an Organized Health Care Education/Training Program | Admitting: Student in an Organized Health Care Education/Training Program

## 2024-02-13 ENCOUNTER — Ambulatory Visit
Admission: RE | Admit: 2024-02-13 | Discharge: 2024-02-13 | Disposition: A | Discharge status: Home / Self Care | Payer: PRIVATE HEALTH INSURANCE | Source: Ambulatory Visit | Attending: Student in an Organized Health Care Education/Training Program | Admitting: Student in an Organized Health Care Education/Training Program

## 2024-02-13 VITALS — BP 172/85 | HR 75 | Temp 98.4°F | Resp 17 | Ht 60.0 in | Wt 90.0 lb

## 2024-02-13 DIAGNOSIS — G588 Other specified mononeuropathies: Secondary | ICD-10-CM | POA: Insufficient documentation

## 2024-02-13 DIAGNOSIS — G894 Chronic pain syndrome: Secondary | ICD-10-CM | POA: Insufficient documentation

## 2024-02-13 DIAGNOSIS — G629 Polyneuropathy, unspecified: Secondary | ICD-10-CM | POA: Diagnosis present

## 2024-02-13 MED ORDER — LIDOCAINE HCL 2 % IJ SOLN
20.0000 mL | Freq: Once | INTRAMUSCULAR | Status: AC
  Administered 2024-02-13: 200 mg

## 2024-02-13 MED ORDER — DEXAMETHASONE SODIUM PHOSPHATE 10 MG/ML IJ SOLN
10.0000 mg | Freq: Once | INTRAMUSCULAR | Status: AC
  Administered 2024-02-13: 10 mg

## 2024-02-13 MED ORDER — ROPIVACAINE HCL 2 MG/ML IJ SOLN
9.0000 mL | Freq: Once | INTRAMUSCULAR | Status: AC
  Administered 2024-02-13: 9 mL via PERINEURAL

## 2024-02-13 MED ORDER — DEXAMETHASONE SODIUM PHOSPHATE 10 MG/ML IJ SOLN
INTRAMUSCULAR | Status: AC
  Filled 2024-02-13: qty 1

## 2024-02-13 MED ORDER — IOHEXOL 180 MG/ML  SOLN
INTRAMUSCULAR | Status: AC
  Filled 2024-02-13: qty 20

## 2024-02-13 MED ORDER — LIDOCAINE HCL (PF) 2 % IJ SOLN
INTRAMUSCULAR | Status: AC
  Filled 2024-02-13: qty 10

## 2024-02-13 MED ORDER — ROPIVACAINE HCL 2 MG/ML IJ SOLN
INTRAMUSCULAR | Status: AC
  Filled 2024-02-13: qty 20

## 2024-02-13 MED ORDER — IOHEXOL 180 MG/ML  SOLN
10.0000 mL | Freq: Once | INTRAMUSCULAR | Status: AC
  Administered 2024-02-13: 10 mL via EPIDURAL

## 2024-02-13 NOTE — Patient Instructions (Signed)

## 2024-02-13 NOTE — Progress Notes (Signed)
 Safety precautions to be maintained throughout the outpatient stay will include: orient to surroundings, keep bed in low position, maintain call bell within reach at all times, provide assistance with transfer out of bed and ambulation.

## 2024-02-13 NOTE — Progress Notes (Signed)
 PROVIDER NOTE: Interpretation of information contained herein should be left to medically-trained personnel. Specific patient instructions are provided elsewhere under "Patient Instructions" section of medical record. This document was created in part using STT-dictation technology, any transcriptional errors that may result from this process are unintentional.  Patient: Terry Paul Type: Established DOB: [birth year] MRN: [id number] PCP: Jenelle Mis, FNP  Service: Procedure DOS: 02/13/2024 Setting: Ambulatory Location: Ambulatory outpatient facility Delivery: Face-to-face Provider: Cephus Collin, MD Specialty: Interventional Pain Management Specialty designation: 09 Location: Outpatient facility Ref. Prov.: Jenelle Mis, FNP       Interventional Therapy   Procedure: Left Inferior Cluneal Nerve block, diagnostic  Imaging: Fluoroscopy-guided  and US -guided, Non-spinal ([id number]) Anesthesia: Local anesthesia (1-2% Lidocaine ) Sedation: No Sedation                       DOS: 02/13/2024  Performed by: Cephus Collin, MD  Purpose: Diagnostic/Therapeutic Indications: Inferior buttock pain (especially near the gluteal fold), Suspected inferior cluneal nerve entrapment or neuralgia Rationale (medical necessity): procedure needed and proper for the diagnosis and/or treatment of Terry Paul's medical symptoms and needs. 1. Cluneal neuropathy   2. Chronic pain syndrome    NAS-11 Pain score:   Pre-procedure: 2 /10   Post-procedure: 0-No pain/10      Position / Prep / Materials:  Position: Prone  Prep solution: ChloraPrep (2% chlorhexidine gluconate and 70% isopropyl alcohol) Prep Area: Entire posterior sacral area Materials:  Tray: Block Needle(s):  Type: Spinal  Gauge (G): 22  Length: 3.5-in Qty: One (1) per procedure side.  H&P (Pre-op Assessment):  Terry Paul is a 33 y.o. (year old), female patient, seen today for interventional treatment. She  has a past surgical history that  includes Cesarean section (N/A, 04/26/2013). Terry Paul has a current medication list which includes the following prescription(s): ibuprofen , meloxicam, pregabalin , and tizanidine . Her primarily concern today is the Other (buttock)  Initial Vital Signs:  Pulse/HCG Rate: 75ECG Heart Rate: (!) 102 Temp: 98.4 F (36.9 C) Resp: (!) 21 BP: (!) 144/88 SpO2: 100 %  BMI: Estimated body mass index is 17.58 kg/m as calculated from the following:   Height as of this encounter: 5' (1.524 m).   Weight as of this encounter: 90 lb (40.8 kg).  Risk Assessment: Allergies: Reviewed. She has no known allergies.  Allergy Precautions: None required Coagulopathies: Reviewed. None identified.  Blood-thinner therapy: None at this time Active Infection(s): Reviewed. None identified. Terry Paul is afebrile  Site Confirmation: Terry Paul was asked to confirm the procedure and laterality before marking the site Procedure checklist: Completed Consent: Before the procedure and under the influence of no sedative(s), amnesic(s), or anxiolytics, the patient was informed of the treatment options, risks and possible complications. To fulfill our ethical and legal obligations, as recommended by the American Medical Association's Code of Ethics, I have informed the patient of my clinical impression; the nature and purpose of the treatment or procedure; the risks, benefits, and possible complications of the intervention; the alternatives, including doing nothing; the risk(s) and benefit(s) of the alternative treatment(s) or procedure(s); and the risk(s) and benefit(s) of doing nothing. The patient was provided information about the general risks and possible complications associated with the procedure. These may include, but are not limited to: failure to achieve desired goals, infection, bleeding, organ or nerve damage, allergic reactions, paralysis, and death. In addition, the patient was informed of those risks and  complications associated to the procedure, such as failure to decrease  pain; infection; bleeding; organ or nerve damage with subsequent damage to sensory, motor, and/or autonomic systems, resulting in permanent pain, numbness, and/or weakness of one or several areas of the body; allergic reactions; (i.e.: anaphylactic reaction); and/or death. Furthermore, the patient was informed of those risks and complications associated with the medications. These include, but are not limited to: allergic reactions (i.e.: anaphylactic or anaphylactoid reaction(s)); adrenal axis suppression; blood sugar elevation that in diabetics may result in ketoacidosis or comma; water retention that in patients with history of congestive heart failure may result in shortness of breath, pulmonary edema, and decompensation with resultant heart failure; weight gain; swelling or edema; medication-induced neural toxicity; particulate matter embolism and blood vessel occlusion with resultant organ, and/or nervous system infarction; and/or aseptic necrosis of one or more joints. Finally, the patient was informed that Medicine is not an exact science; therefore, there is also the possibility of unforeseen or unpredictable risks and/or possible complications that may result in a catastrophic outcome. The patient indicated having understood very clearly. We have given the patient no guarantees and we have made no promises. Enough time was given to the patient to ask questions, all of which were answered to the patient's satisfaction. Terry Paul has indicated that she wanted to continue with the procedure. Attestation: I, the ordering provider, attest that I have discussed with the patient the benefits, risks, side-effects, alternatives, likelihood of achieving goals, and potential problems during recovery for the procedure that I have provided informed consent. Date  Time: 02/13/2024  1:51 PM  Pre-Procedure Preparation:  Monitoring: As per  clinic protocol. Respiration, ETCO2, SpO2, BP, heart rate and rhythm monitor placed and checked for adequate function Safety Precautions: Patient was assessed for positional comfort and pressure points before starting the procedure. Time-out: I initiated and conducted the "Time-out" before starting the procedure, as per protocol. The patient was asked to participate by confirming the accuracy of the "Time Out" information. Verification of the correct person, site, and procedure were performed and confirmed by me, the nursing staff, and the patient. "Time-out" conducted as per Joint Commission's Universal Protocol (UP.01.01.01). Time: 1427 Start Time: 1427 hrs.  Description/Narrative of Procedure:          Start Time: 1427 hrs.  Anatomical Target The inferior cluneal nerves pierce the gluteal fascia near the lower border of the gluteus maximus, crossing over the sacrotuberous ligament near the ischial tuberosity.  Landmarks: Ischial tuberosity, sacrotuberous ligament, gluteal fold  Patient Positioning: Prone position with a pillow under the abdomen to flatten the lumbar lordosis and improve access to the gluteal region  Fluoroscopic Technique The ischial tuberosity on the left was identified and traced toward the inferior margin of the sacrum. Just medial and superior to the ischial tuberosity, along the expected course of the inferior cluneal nerves crossing the sacrotuberous ligament, a 22G, 3.5 inch spinal needle, was inserted at a 45 degree angle toward the ischial tuberosity and advanced under intermittent fluoroscopy until it reached the periosteal surface of the ischial tuberosity, slightly lateral to the sacrum Injected 2 mL of non-ionic contrast to confirm non vascular and subcutaneous spread.  Afterwards, a 5 cc solution made of 4 cc of 0.2% ropivacaine, 1 cc of Decadron 10 mg/cc was injected.   Patient tolerated the procedure well without any complications  Vitals:   02/13/24 1423  02/13/24 1426 02/13/24 1429 02/13/24 1438  BP: (!) 149/95 (!) 150/105 (!) 153/103 (!) 172/85  Pulse:      Resp: 17 20 17  17  Temp:      SpO2: 94% 100% 100%   Weight:      Height:         End Time: 1437 hrs.  Imaging Guidance (Non-Spinal):          Type of Imaging Technique: Fluoroscopy Guidance (Non-Spinal) & ultrasound guidance Indication(s): Fluoroscopy  and US  guidance for needle placement to enhance accuracy in procedures requiring precise needle localization for targeted delivery of medication in or near specific anatomical locations not easily accessible without such real-time imaging assistance. Exposure Time: Please see nurses notes. Contrast: Before injecting any contrast, we confirmed that the patient did not have an allergy to iodine, shellfish, or radiological contrast. Once satisfactory needle placement was completed at the desired level, radiological contrast was injected. Contrast injected under live fluoroscopy. No contrast complications. See chart for type and volume of contrast used. Fluoroscopic Guidance: I was personally present during the use of fluoroscopy. "Tunnel Vision Technique" used to obtain the best possible view of the target area. Parallax error corrected before commencing the procedure. "Direction-depth-direction" technique used to introduce the needle under continuous pulsed fluoroscopy. Once target was reached, antero-posterior, oblique, and lateral fluoroscopic projection used confirm needle placement in all planes. Images permanently stored in EMR. Interpretation: I personally interpreted the imaging intraoperatively. Adequate needle placement confirmed in multiple planes. Appropriate spread of contrast into desired area was observed. No evidence of afferent or efferent intravascular uptake. Permanent images saved into the patient's record.  Post-operative Assessment:  Post-procedure Vital Signs:  Pulse/HCG Rate: 7592 Temp: 98.4 F (36.9 C) Resp: 17 BP:  (!) 172/85 SpO2: 100 %  EBL: None  Complications: No immediate post-treatment complications observed by team, or reported by patient.  Note: The patient tolerated the entire procedure well. A repeat set of vitals were taken after the procedure and the patient was kept under observation following institutional policy, for this type of procedure. Post-procedural neurological assessment was performed, showing return to baseline, prior to discharge. The patient was provided with post-procedure discharge instructions, including a section on how to identify potential problems. Should any problems arise concerning this procedure, the patient was given instructions to immediately contact us , at any time, without hesitation. In any case, we plan to contact the patient by telephone for a follow-up status report regarding this interventional procedure.  Comments:  No additional relevant information.  Plan of Care (POC)  Orders:  Orders Placed This Encounter  Procedures   DG PAIN CLINIC C-ARM 1-60 MIN NO REPORT    Intraoperative interpretation by procedural physician at [facility].    Standing Status:   Standing    Number of Occurrences:   1    Reason for exam::   Assistance in needle guidance and placement for procedures requiring needle placement in or near specific anatomical locations not easily accessible without such assistance.     Medications ordered for procedure: Meds ordered this encounter  Medications   iohexol (OMNIPAQUE) 180 MG/ML injection 10 mL    Must be Myelogram-compatible. If not available, you may substitute with a water-soluble, non-ionic, hypoallergenic, myelogram-compatible radiological contrast medium.   lidocaine  (XYLOCAINE ) 2 % (with pres) injection 400 mg   ropivacaine (PF) 2 mg/mL (0.2%) (NAROPIN) injection 9 mL   dexamethasone (DECADRON) injection 10 mg   Medications administered: We administered iohexol, lidocaine , ropivacaine (PF) 2 mg/mL (0.2%), and  dexamethasone.  See the medical record for exact dosing, route, and time of administration.  Follow-up plan:   Return in about 4 weeks (  around 03/12/2024), or PPE F2F.       Left inferior cluneal nerve block 02/13/2024    Recent Visits Date Type Provider Dept  01/10/24 Office Visit Cephus Collin, MD [facility]  Showing recent visits within past 90 days and meeting all other requirements Today's Visits Date Type Provider Dept  02/13/24 Procedure visit Cephus Collin, MD [facility]  Showing today's visits and meeting all other requirements Future Appointments Date Type Provider Dept  03/20/24 Appointment Cephus Collin, MD [facility]  Showing future appointments within next 90 days and meeting all other requirements  Disposition: Discharge home  Discharge (Date  Time): 02/13/2024; 1448 hrs.   Primary Care Physician: Jenelle Mis, FNP Location: [facility] Note by: Cephus Collin, MD (TTS technology used. I apologize for any typographical errors that were not detected and corrected.) Date: 02/13/2024; Time: 2:59 PM  Disclaimer:  Medicine is not an Visual merchandiser. The only guarantee in medicine is that nothing is guaranteed. It is important to note that the decision to proceed with this intervention was based on the information collected from the patient. The Data and conclusions were drawn from the patient's questionnaire, the interview, and the physical examination. Because the information was provided in large part by the patient, it cannot be guaranteed that it has not been purposely or unconsciously manipulated. Every effort has been made to obtain as much relevant data as possible for this evaluation. It is important to note that the conclusions that lead to this procedure are derived in large part from the available data. Always take into account that the treatment will also be dependent on availability of resources and  existing treatment guidelines, considered by other Pain Management Practitioners as being common knowledge and practice, at the time of the intervention. For Medico-Legal purposes, it is also important to point out that variation in procedural techniques and pharmacological choices are the acceptable norm. The indications, contraindications, technique, and results of the above procedure should only be interpreted and judged by a Board-Certified Interventional Pain Specialist with extensive familiarity and expertise in the same exact procedure and technique.

## 2024-02-14 ENCOUNTER — Telehealth: Payer: Self-pay

## 2024-02-14 NOTE — Telephone Encounter (Signed)
 Post procedure follow up.  Patient state she is fine.

## 2024-02-26 ENCOUNTER — Other Ambulatory Visit: Payer: Self-pay | Admitting: Sports Medicine

## 2024-02-26 DIAGNOSIS — M7918 Myalgia, other site: Secondary | ICD-10-CM

## 2024-02-26 NOTE — Telephone Encounter (Signed)
 Order sent for MRI of left femur for evaluation of hamstring.

## 2024-03-07 ENCOUNTER — Other Ambulatory Visit: Payer: PRIVATE HEALTH INSURANCE

## 2024-03-07 ENCOUNTER — Ambulatory Visit
Admission: RE | Admit: 2024-03-07 | Discharge: 2024-03-07 | Disposition: A | Discharge status: Home / Self Care | Payer: PRIVATE HEALTH INSURANCE | Source: Ambulatory Visit | Attending: Sports Medicine | Admitting: Sports Medicine

## 2024-03-07 DIAGNOSIS — M7918 Myalgia, other site: Secondary | ICD-10-CM

## 2024-03-12 ENCOUNTER — Ambulatory Visit: Payer: Self-pay | Admitting: Sports Medicine

## 2024-03-20 ENCOUNTER — Ambulatory Visit: Payer: PRIVATE HEALTH INSURANCE | Admitting: Student in an Organized Health Care Education/Training Program

## 2024-03-20 ENCOUNTER — Telehealth: Payer: PRIVATE HEALTH INSURANCE | Admitting: Student in an Organized Health Care Education/Training Program

## 2024-03-23 ENCOUNTER — Other Ambulatory Visit: Payer: Self-pay | Admitting: Sports Medicine

## 2024-03-23 ENCOUNTER — Other Ambulatory Visit: Payer: Self-pay | Admitting: Family Medicine

## 2024-03-23 DIAGNOSIS — G5782 Other specified mononeuropathies of left lower limb: Secondary | ICD-10-CM

## 2024-03-23 DIAGNOSIS — G8929 Other chronic pain: Secondary | ICD-10-CM

## 2024-03-23 DIAGNOSIS — G5702 Lesion of sciatic nerve, left lower limb: Secondary | ICD-10-CM

## 2024-04-10 ENCOUNTER — Encounter: Payer: Self-pay | Admitting: Family Medicine

## 2024-04-10 ENCOUNTER — Telehealth: Payer: Self-pay | Admitting: *Deleted

## 2024-04-10 NOTE — Telephone Encounter (Signed)
 Received call from patient.   Patient requested appointment with Dr. Mavis for evaluation of possible abdominal hernia.   Patient states that she has not had area of concern evaluated previously. Reports that she has some bulging of her abdomen in certain positions.   Advised that [facility] requires referral from either UC/ ER or PCP after initial work up is completed, including physical exam and imaging.

## 2024-04-15 ENCOUNTER — Ambulatory Visit
Payer: PRIVATE HEALTH INSURANCE | Attending: Student in an Organized Health Care Education/Training Program | Admitting: Nurse Practitioner

## 2024-04-15 DIAGNOSIS — G894 Chronic pain syndrome: Secondary | ICD-10-CM | POA: Insufficient documentation

## 2024-04-15 DIAGNOSIS — M533 Sacrococcygeal disorders, not elsewhere classified: Secondary | ICD-10-CM | POA: Diagnosis not present

## 2024-04-15 DIAGNOSIS — G5702 Lesion of sciatic nerve, left lower limb: Secondary | ICD-10-CM

## 2024-04-15 DIAGNOSIS — G588 Other specified mononeuropathies: Secondary | ICD-10-CM | POA: Diagnosis not present

## 2024-04-15 NOTE — Progress Notes (Signed)
 PROVIDER NOTE: Interpretation of information contained herein should be left to medically-trained personnel. Specific patient instructions are provided elsewhere under Patient Instructions section of medical record. This document was created in part using AI and STT-dictation technology, any transcriptional errors that may result from this process are unintentional.  Patient: Terry Paul  Service: E/M   PCP: Terry Jeoffrey RAMAN, FNP  DOB: Jun 09, 1991  DOS: 04/15/2024  Provider: Emmy MARLA Blanch, NP  MRN: [id number]  Delivery: Virtual Visit  Specialty: Interventional Pain Management  Type: Established Patient  Setting: Ambulatory outpatient facility  Specialty designation: 09  Referring Prov.: Terry Jeoffrey RAMAN, FNP  Location: Remote location       Virtual Encounter - Pain Management PROVIDER NOTE: Information contained herein reflects review and annotations entered in association with encounter. Interpretation of such information and data should be left to medically-trained personnel. Information provided to patient can be located elsewhere in the medical record under Patient Instructions. Document created using STT-dictation technology, any transcriptional errors that may result from process are unintentional.    Contact & Paul Preferred: [phone] Home: [phone] (home) Mobile: [phone] (mobile) E-mail: [email or web address]  Terry Paul [id number] - [address] [place] - [address] [facility] [address] [place] [address] Phone: [phone] Fax: [phone]   Pre-screening  [name] offered in-person vs virtual encounter. She indicated preferring virtual for this encounter.   Reason COVID-19*  Social distancing based on CDC and AMA recommendations.   I contacted Terry Paul on 04/15/2024 via telephone.      I clearly identified myself as Terry MARLA Blanch, NP. I verified that I was speaking with the correct person using two identifiers (Name: Terry Paul, and date  of birth: 1991/04/26).  Consent I sought verbal advanced consent from Terry Paul for virtual visit interactions. I informed Terry Paul of possible security and privacy concerns, risks, and limitations associated with providing not-in-person medical evaluation and management services. I also informed Terry Paul of the availability of in-person appointments. Finally, I informed her that there would be a charge for the virtual visit and that she could be  personally, fully or partially, financially responsible for it. [name] expressed understanding and agreed to proceed.   Historic Elements   [name] is a 33 y.o. year old, female patient evaluated today after our last contact on Visit date not found. Terry Paul  has a past medical history of Preeclampsia and UTI (lower urinary tract infection). She also  has a past surgical history that includes Cesarean section (N/A, 04/26/2013). Terry Paul has a current medication list which includes the following prescription(s): ibuprofen , meloxicam, pregabalin , and tizanidine . She  reports that she has never smoked. She has never used smokeless tobacco. She reports that she does not drink alcohol and does not use drugs. Terry Paul has no known allergies.  BMI: Estimated body mass index is 17.58 kg/m as calculated from the following:   Height as of 02/13/24: 5' (1.524 m).   Weight as of 02/13/24: 90 lb (40.8 kg). Last encounter: Visit date not found. Last procedure: Visit date not found.  HPI  Today, she is being contacted for a post-procedure assessment.  Procedure Procedure: Left Inferior Cluneal Nerve block, diagnostic   Imaging: Fluoroscopy-guided  and US -guided, Non-spinal ([id number]) Anesthesia: Local anesthesia (1-2% Lidocaine ) Sedation: No Sedation                       DOS: 02/13/2024  Performed by: Terry Sherry, MD  Purpose: Diagnostic/Therapeutic Indications: Inferior buttock pain (especially near the gluteal fold), Suspected  inferior cluneal nerve entrapment or neuralgia Rationale (medical necessity): procedure needed and proper for the diagnosis and/or treatment of Terry Paul's medical symptoms and needs. 1. Cluneal neuropathy   2. Chronic pain syndrome     NAS-11 Pain score:        Pre-procedure: 2 /10        Post-procedure: 0-No pain/10    Post-Procedure Evaluation   Effectiveness:  Initial hour after procedure:   100%. Subsequent 4-6 hours post-procedure:   0%. Analgesia past initial 6 hours:   0%. Ongoing improvement:  Analgesic:  Terry Paul underwent a diagnostic/therapeutic left inferior cluneal nerve block on Feb 13, 2024.  She reports 100% during the local anesthetic; however she has not experience any improvement or relief since the procedure.  Function: No improvement  ROM: No improvement Pharmacotherapy Assessment  Monitoring:  PMP: PDMP not reviewed this encounter.       Pharmacotherapy: No side-effects or adverse reactions reported. Compliance: No problems identified. Effectiveness: Clinically acceptable. Plan: Refer to POC.  UDS: No results found for: SUMMARY No results found for: Terry Paul, [id number]  Laboratory Chemistry Profile   Renal Lab Results  Component Value Date   BUN 9 06/12/2019   CREATININE 0.64 06/12/2019   BCR 14 06/12/2019   GFRAA 138 06/12/2019   GFRNONAA 111 01/05/2023    Hepatic Lab Results  Component Value Date   AST 16 06/12/2019   ALT 11 06/12/2019   ALBUMIN 4.8 06/12/2019   ALKPHOS 68 06/12/2019    Electrolytes Lab Results  Component Value Date   NA 139 06/12/2019   K 4.0 06/12/2019   CL 100 06/12/2019   CALCIUM 9.9 04/02/2023    Bone Lab Results  Component Value Date   VD25OH 41.36 04/02/2023    Inflammation (CRP: Acute Phase) (ESR: Chronic Phase) No results found for: CRP, ESRSEDRATE, LATICACIDVEN       Note: Above Lab results reviewed.  Imaging  MR FEMUR LEFT WO CONTRAST EXAM DESCRIPTION: MR FEMUR LEFT  WO CONTRAST  CLINICAL HISTORY: Leg pain, chronic (Ped 0-17y)  COMPARISON: None available  TECHNIQUE: Non contrast MRI of the brain is performed according to our usual protocol including multiplanar multi sequence technique.  FINDINGS: No fracture. No erosions. No significant degenerative change. The marrow signal is unremarkable. No significant joint effusion or bursal fluid. The visualized tendons and musculature are unremarkable. No subcutaneous edema.  IMPRESSION: Unremarkable exam.  Electronically signed by: Reyes Frees MD 03/07/2024 07:50 PM EDT RP Workstation: [id number]  Assessment  The primary encounter diagnosis was Cluneal neuropathy. Diagnoses of Chronic pain syndrome, Sacroiliac joint pain, and Piriformis syndrome of left side were also pertinent to this visit.  Plan of Care  Problem-specific:  The patient did not experience any relief or improvement from the inferior cluneal nerve block.  Dr. Marcelino is not comfortable performing a medial cluneal nerve block.  The patient may consider an Ischiofemoral injection with Dr. Burnetta or explore other treatment option he offers.  She can also be referred to a neurosurgeon or orthopedic specialist for evaluation of possible medial cluneal nerve block by Dr. Claudene or Dr. Burnetta.   Ms. Vianka Ertel has a current medication list which includes the following long-term medication(s): pregabalin .  Pharmacotherapy (Medications Ordered): No orders of the defined types were placed in this encounter.  Orders:  No orders of the defined types were placed in this encounter.  Follow-up plan:   No  follow-ups on file.          Recent Visits Date Type Provider Dept  02/13/24 Procedure visit Marcelino Nurse, MD [facility]  Showing recent visits within past 90 days and meeting all other requirements Today's Visits Date Type Provider Dept  04/15/24 Office Visit Kimber Esterly K, NP [facility]  Showing today's  visits and meeting all other requirements Future Appointments No visits were found meeting these conditions. Showing future appointments within next 90 days and meeting all other requirements  I discussed the assessment and treatment plan with the patient. The patient was provided an opportunity to ask questions and all were answered. The patient agreed with the plan and demonstrated an understanding of the instructions.  Patient advised to call back or seek an in-person evaluation if the symptoms or condition worsens.  Duration of encounter: 30 minutes.  Note by: Terry MARLA Blanch, NP Date: 04/15/2024; Time: 2:18 PM

## 2024-04-18 ENCOUNTER — Ambulatory Visit: Payer: PRIVATE HEALTH INSURANCE | Admitting: Surgery

## 2024-04-18 ENCOUNTER — Other Ambulatory Visit (INDEPENDENT_AMBULATORY_CARE_PROVIDER_SITE_OTHER): Payer: PRIVATE HEALTH INSURANCE | Admitting: Neurosurgery

## 2024-04-18 DIAGNOSIS — M7918 Myalgia, other site: Secondary | ICD-10-CM

## 2024-04-18 NOTE — Progress Notes (Signed)
 Referral placed for medial cluneal nerve injection at [facility].

## 2024-04-22 ENCOUNTER — Ambulatory Visit: Payer: PRIVATE HEALTH INSURANCE | Admitting: Sports Medicine

## 2024-04-28 ENCOUNTER — Encounter: Payer: Self-pay | Admitting: Neurosurgery

## 2024-04-28 ENCOUNTER — Ambulatory Visit (INDEPENDENT_AMBULATORY_CARE_PROVIDER_SITE_OTHER): Payer: PRIVATE HEALTH INSURANCE | Admitting: Surgery

## 2024-04-28 ENCOUNTER — Ambulatory Visit (INDEPENDENT_AMBULATORY_CARE_PROVIDER_SITE_OTHER): Payer: PRIVATE HEALTH INSURANCE | Admitting: Neurosurgery

## 2024-04-28 ENCOUNTER — Encounter: Payer: Self-pay | Admitting: Surgery

## 2024-04-28 VITALS — BP 124/78 | Ht 62.0 in | Wt 90.0 lb

## 2024-04-28 VITALS — BP 174/71 | HR 87 | Temp 98.1°F | Ht 60.0 in | Wt 92.4 lb

## 2024-04-28 DIAGNOSIS — K439 Ventral hernia without obstruction or gangrene: Secondary | ICD-10-CM

## 2024-04-28 DIAGNOSIS — M419 Scoliosis, unspecified: Secondary | ICD-10-CM

## 2024-04-28 DIAGNOSIS — M7918 Myalgia, other site: Secondary | ICD-10-CM

## 2024-04-28 DIAGNOSIS — M5116 Intervertebral disc disorders with radiculopathy, lumbar region: Secondary | ICD-10-CM

## 2024-04-28 NOTE — Progress Notes (Signed)
 Referring Physician:  Kayla Jeoffrey RAMAN, FNP [address] [place],  [place] [address]  Primary Physician:  Kayla Jeoffrey RAMAN, FNP  History of Present Illness: 04/28/2024 Terry Paul is here today with a chief complaint of left-sided buttocks pain.  She had extensive workup and had diagnoses including piriformis syndrome, sciatic neuropathy, nerve pain.  She has had Hydro dissections, multiple attempts at dry needling, EMGs, neurology consults but has not yet had a full diagnosis.  She continues to have pain.  She is here today for further evaluation.   Certain articles of tight clothing and certain types of seated positioning tend to bother her.  This mostly in her left buttocks.  Very rarely will it rotate or radiate down her lower extremity.  She has pain both at the medial aspect of her gluteal insertion on her sacrum as well as her hamstrings insertion on her ischial tuberosity. She has not Had significant improvement with her physical therapy or injections.  She has not yet had chiropractic evaluation.  Conservative measures: EMG on 06/21/23, she has also had dry needling Physical therapy: currently participating in at [facility], initial evaluation on 10/22/23, has helped some but pain is still there when she sits Multimodal medical therapy including regular antiinflammatories: lyrica , tizanidine , ibuprofen ,  Injections:steroid injections by Dr. Arthea Sharps   3 Ultrasound-guided sciatic nerve hydrodissection  Injections by Dr. Burnetta    Past Surgery: no spinal surgeries   The symptoms are causing a significant impact on the patient's life.   I have utilized the care everywhere function in epic to review the outside records available from external health systems.  Review of Systems:  A 10 point review of systems is negative, except for the pertinent positives and negatives detailed in the HPI.  Past Medical History: Past Medical History:  Diagnosis Date    Preeclampsia    UTI (lower urinary tract infection)     Past Surgical History: Past Surgical History:  Procedure Laterality Date   CESAREAN SECTION N/A 04/26/2013   Procedure: Primary cesarean section  with delivery of twin  A girl at 97. Twin B girl at 56.;  Surgeon: Truman Corona, MD;  Location: [facility];  Service: Obstetrics;  Laterality: N/A;    Allergies: Allergies as of 04/28/2024   (No Known Allergies)    Medications:  Current Outpatient Medications:    ibuprofen  (ADVIL ) 200 MG tablet, Take 200 mg by mouth every 6 (six) hours as needed., Disp: , Rfl:    pregabalin  (LYRICA ) 75 MG capsule, TAKE 1 CAPSULE BY MOUTH 2 TIMES A DAY, Disp: 60 capsule, Rfl: 1   tiZANidine  (ZANAFLEX ) 4 MG tablet, TAKE 1 TABLET BY MOUTH ONCE NIGHTLY AS DIRECTED, Disp: 30 tablet, Rfl: 2  Social History: Social History   Tobacco Use   Smoking status: Never   Smokeless tobacco: Never  Vaping Use   Vaping status: Never Used  Substance Use Topics   Alcohol use: Never   Drug use: No    Family Medical History: Family History  Problem Relation Age of Onset   Healthy Mother    Healthy Father     Physical Examination: Vitals:   04/28/24 1355  BP: 124/78    General: Patient is in no apparent distress. Attention to examination is appropriate.  Neck:   Supple.  Full range of motion.  Respiratory: Patient is breathing without any difficulty.   NEUROLOGICAL:     Awake, alert, oriented to person, place, and  time.  Speech is clear and fluent.   Cranial Nerves: Pupils equal round and reactive to light.  Facial tone is symmetric.  Facial sensation is symmetric. Shoulder shrug is symmetric. Tongue protrusion is midline.    Strength: She does not show any major motor deficits in her lower extremity including no weakness in her sciatic distribution musculature.  Unable to clearly find a point of a Tinel.  She does have point tenderness near the medial insertion of her gluteal muscles on her  sacrum.  She also has point tenderness at her hamstrings insertion on the ischial tuberosity.  On today's evaluation this does not show radiation down her legs or into the cluneal distribution.  Imaging: Narrative & Impression  CLINICAL DATA:  Left buttock and leg pain for 8 months, no known injury   EXAM: MRI PELVIS WITHOUT AND WITH CONTRAST   TECHNIQUE: Multiplanar multisequence MR imaging of the pelvis was performed both before and after administration of intravenous contrast.   CONTRAST:  3.5 mL Vueway  gadolinium contrast IV   COMPARISON:  None Available.   FINDINGS: Urinary Tract:  No abnormality visualized.   Bowel:  Unremarkable visualized pelvic bowel loops.   Vascular/Lymphatic: No pathologically enlarged lymph nodes. No significant vascular abnormality seen.   Reproductive: No mass. C-section scar of the lower anterior uterine segment. Numerous bilateral ovarian follicles, benign, requiring no further follow-up or characterization. Prominent bilateral ovarian and adnexal varices (series 9, image 17).   Other:  Small volume free fluid in the low pelvis.   Musculoskeletal: No suspicious bone lesions identified. Mild pubic symphysis arthrosis (series 6, image 28).   IMPRESSION: 1. No acute MR findings of the pelvis to explain pain. 2. Prominent bilateral ovarian and adnexal varices, which can be seen in the setting of pelvic congestion if referable signs and symptoms are present. 3. Small volume free fluid in the low pelvis, likely functional in the reproductive age setting. 4. Mild pubic symphysis arthrosis.     Electronically Signed   By: Marolyn JONETTA Jaksch M.D.   On: 05/15/2023 11:01     Study Result  Narrative & Impression    [facility] [address], [address] [place], [place] [address] [phone]   NEUROIMAGING REPORT     STUDY DATE: 01/21/2023 PATIENT NAME: Terry Paul DOB: 02-14-1991 MRN: [id number]   EXAM: MRI of the  cervical spine   ORDERING CLINICIAN: Richard A. Vear, MD. PhD CLINICAL HISTORY: 33 year old woman with left leg numbness.  Assess for demyelination COMPARISON FILMS: None   TECHNIQUE: MRI of the cervical spine was obtained utilizing 3 mm sagittal slices from the posterior fossa down to the T3-4 level with T1, T2 and inversion recovery views. In addition 4 mm axial slices from C2-3 down to T1-2 level were included with T2 and gradient echo views. CONTRAST: None IMAGING SITE: [facility], [address] [place], [place], [place]   FINDINGS: :  On sagittal images, the spine is imaged from above the cervicomedullary junction to T2.   The visible brain and the cervicomedullary junction appears normal.  Paravertebral soft tissue appears normal.  The spinal cord is of normal caliber and signal.   The vertebral bodies are normally aligned.   The vertebral bodies have normal signal.     The discs and interspaces were further evaluated on axial views from C2 to T1.  At C5-C6 there is a small central disc bulge.  There is no spinal stenosis, foraminal narrowing or nerve root compression  IMPRESSION: This MRI of the cervical spine without contrast shows the following: The spinal cord appears normal. Small disc bulges C5-C6 but no spinal stenosis or nerve root compression.       INTERPRETING PHYSICIAN:  Richard A. Vear, MD, PhD, FAAN Certified in  Neuroimaging by American Society of Neuroimaging           Full Name:      Terry Paul    Gender:          Female MRN #:            [id number]      Date of Birth: [birth year]    Visit Date:      06/21/2023 09:11 Age:    89 Years Examining Physician:          Dr. Onetha Epp Referring Physician: Dr. Charlie Vear Height:            5 feet 0 inch    History: Terry Paul is a 33 y.o. female with left leg and foot numbness, left-sided sciatica. Right leg is starting to have similar issues but not as significant as the left leg,   Summary:  EMG/Nerve Conduction Studies were performed on the bilateral lower extremities. All nerves and muscles (as indicated in the following tables) were within normal limits.     Conclusion: This is a normal study of the bilateral lower extremities.  No evidence for mononeuropathy, polyneuropathy, lumbar radiculopathy or muscle disease.     ------------------------------- Onetha Epp, M.D.   [facility] [address], [address] [place], [place] [address] Tel: [phone] Fax: [phone]   Verbal informed consent was obtained from the patient, patient was informed of potential risk of procedure, including bruising, bleeding, hematoma formation, infection, muscle weakness, muscle pain, numbness, among others.     I have personally reviewed the images and agree with the above interpretation.  I also reviewed her pelvic MRI to evaluate the L5-S1 interval for any possible low lumbar sacral radiculopathy, I did not see any evidence.  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  Medical Decision Making/Assessment and Plan: Terry Paul is a pleasant 33 y.o. female with over a year of left-sided buttocks pain.  She has been dealing with this for significant amount of time, sometimes it radiates down her legs, most of the time however does not radiate past her knee.  She states that a majority of her pain is in her left buttocks and worsens with sitting.  She does not notice any other certain activities that make it worse.  Her physical examination shows good strength with no clear muscle wasting.  Her EMG and nerve conduction study demonstrated no clear radiculopathy or mononeuropathy sensory nerve conduction specifically was within normal limits.  Her MRI also demonstrated no evidence of sciatic neuropathy.  She has had sciatic nerve hydrodissection's, piriformis injections, needling, cluneal injections without significant improvement.  She  does not have any instability noted in her lumbar spine, however she does have a scoliosis.  She does have some hip I abnormality and slight rotation of her lumbar spine.  This can sometimes be associated with musculoskeletal pain including hamstrings low back strain and buttocks pain.  Given the fact that most of this is reproducible by palpation, I am more in favor of musculoskeletal etiology of her issues rather than neuropathic etiologies.  I will reach out to her team and see whether or not she may  benefit from possible trigger point injections at the areas of most concern.  At this point we will not plan on medial cluneal nerve injections,.  We can continue to follow as needed.  Thank you for involving me in the care of this patient.    Penne MICAEL Sharps MD/MSCR Neurosurgery   Spent a total of 30 minutes reviewing her chart, going over outside records, reviewing her imaging, face-to-face evaluation, and counseling of her care going forward.

## 2024-04-28 NOTE — Progress Notes (Signed)
 04/28/2024  Reason for Visit:  Ventral hernia  History of Present Illness: Terry Paul is a 33 y.o. female presenting for evaluation of a ventral hernia.  She reports having an area of intermittent bulging over the past couple years, located in the right upper quadrant.  It is not bulging all the time or frequently, but depends on her activity level and positioning.  It's only bulged a few times overall, but has happened more often this year.  No significant pain but does report some tenderness.  Denies any nausea or vomiting.  She does have a history of a c-section in 2014, but has not had any issues with the c-section scar itself and has not had prior surgery in the upper abdomen.  Past Medical History: Past Medical History:  Diagnosis Date   Preeclampsia    UTI (lower urinary tract infection)      Past Surgical History: Past Surgical History:  Procedure Laterality Date   CESAREAN SECTION N/A 04/26/2013   Procedure: Primary cesarean section  with delivery of twin  A girl at 64. Twin B girl at 66.;  Surgeon: Truman Corona, MD;  Location: [facility];  Service: Obstetrics;  Laterality: N/A;    Home Medications: Prior to Admission medications   Medication Sig Start Date End Date Taking? Authorizing Provider  ibuprofen  (ADVIL ) 200 MG tablet Take 200 mg by mouth every 6 (six) hours as needed.   Yes [provider]  pregabalin  (LYRICA ) 75 MG capsule TAKE 1 CAPSULE BY MOUTH 2 TIMES A DAY 03/24/24  Yes Brooks, Dana, DO  tiZANidine  (ZANAFLEX ) 4 MG tablet TAKE 1 TABLET BY MOUTH ONCE NIGHTLY AS DIRECTED 03/23/24  Yes Smith, Zachary M, DO    Allergies: No Known Allergies  Social History:  reports that she has never smoked. She has never used smokeless tobacco. She reports that she does not drink alcohol and does not use drugs.   Family History: Family History  Problem Relation Age of Onset   Healthy Mother    Healthy Father     Review of Systems: Review of Systems   Constitutional:  Negative for chills and fever.  Respiratory:  Negative for shortness of breath.   Cardiovascular:  Negative for chest pain.  Gastrointestinal:  Positive for abdominal pain. Negative for nausea and vomiting.  Genitourinary:  Negative for dysuria.    Physical Exam BP (!) 174/71   Pulse 87   Temp 98.1 F (36.7 C) (Oral)   Ht 5' (1.524 m)   Wt 92 lb 6.4 oz (41.9 kg)   SpO2 99%   BMI 18.05 kg/m  CONSTITUTIONAL: No acute distress HEENT:  Normocephalic, atraumatic, extraocular motion intact. RESPIRATORY:  Lungs are clear, and breath sounds are equal bilaterally. Normal respiratory effort without pathologic use of accessory muscles. CARDIOVASCULAR: Heart is regular without murmurs, gallops, or rubs. GI: The abdomen is soft, non-distended, non-tender to palpation.  When patient coughs, it does seem that there is a small hernia with likely preperitoneal fat bulging in the RUQ, just below the ribs.  Unclear if this could be a spigelian hernia.  No incisions/scars in that area.  There may be a subtle umbilical hernia.  MUSCULOSKELETAL:  Normal muscle strength and tone in all four extremities.  No peripheral edema or cyanosis. NEUROLOGIC:  Motor and sensation is grossly normal.  Cranial nerves are grossly intact. PSYCH:  Alert and oriented to person, place and time. Affect is normal.  Laboratory Analysis: No results found for this or any previous visit (  from the past 24 hours).  Imaging: No results found.  Assessment and Plan: This is a 33 y.o. female with a ventral hernia  --Discussed with the patient that on exam it does appear that she has a small ventral hernia in the right upper quadrant just inferior to the ribs.  It is unclear how this hernia started, as she has not had any surgeries in the area.  Discussed that this could be a spigelian hernia, but it would be best to obtain a CT scan to further evaluate the type of hernia and location so we know better how to treat  it.  There may be a subtle defect at the umbilicus, but exam was not consistent.  CT scan will be able to tell us  better. --Patient will follow up with me after CT scan to review results and discuss surgical plan and schedule her for surgery. --In the meantime, recommended that she avoid strenuous activity or heavy lifting.  I spent 30 minutes dedicated to the care of this patient on the date of this encounter to include pre-visit review of records, face-to-face time with the patient discussing diagnosis and management, and any post-visit coordination of care.   Aloysius Sheree Plant, MD [facility]

## 2024-04-28 NOTE — Patient Instructions (Addendum)
 We have scheduled you for a CT Scan of your Abdomen and Pelvis with contrast. This has been scheduled at [facility] on 05/01/2024. Please arrive there by 1:20 pm. If you need to reschedule your Scan, you may do so by calling [phone]. Please let us  know if you reschedule your scan as we have to get authorization from your insurance for this. Pick up contrast at [address]. Start drinking contrast 2 hours prior to CT appointment. 1)-11:40 2)-12:40. Nothing to eat 4 hours prior.     Belly Hernia (Ventral Hernia): What to Know   A ventral hernia is a bulge of tissue from inside the belly that pushes through a weak area of the belly. Sometimes, the bulge may have tissue from the small intestine or the large intestine. Ventral hernias do not go away without surgery. There are several types of ventral hernias. You may have: A hernia at a place where surgery was done (incisional hernia). A hernia just above the belly button (epigastric or paraumbilical hernia) or at the belly button (umbilical hernia). These can happen because of heavy lifting or straining. A hernia that comes and goes (reducible hernia). It may be visible only when you lift or strain. This type of hernia can be pushed back into the belly. A hernia that traps belly tissue inside the hernia (incarcerated hernia). This hernia cannot be pushed back into the belly. A hernia that cuts off blood flow to the tissues inside the hernia (strangulation hernia). The tissues can start to die if this happens. This is a very painful bulge that cannot be pushed back into the belly. This type of hernia is a medical emergency. What are the causes? This condition happens when tissue in the belly pushes on a weak area in the muscles. What increases the risk? You're more likely to have this condition if: You are 60 years or older. You had belly surgery in the past. This is common if there was an infection after surgery. You had an injury  to the belly. You often lift or push heavy objects. You have been pregnant several times. You have long-term (chronic) health conditions that put pressure in your belly. These include: Being overweight or obese. Having a buildup of fluid inside your belly (ascites). You throw up or cough over and over again. You have trouble pooping (constipation). You strain to poop or pee. What are the signs or symptoms? The only symptom of a ventral hernia may be a painless bulge in the belly.  Reducible hernia may be visible only when you strain, cough, or lift. Other symptoms may include: Dull pain. A feeling of pressure. Symptoms of an incarcerated hernia may include: Tenderness at hernia site. Bloating. Throwing up or feeling like you may throw up. Trouble pooping or no pooping at all. Symptoms of a strangulated hernia may include: More pain. Throwing up or feeling like you may throw up. Pain when pressing on the hernia. The skin over the hernia turning red or purple. Trouble pooping. Blood in the poop. How is this diagnosed? This condition may be diagnosed based on: Your symptoms and medical history. A physical exam. You may be asked to cough or strain while standing. These actions increase the pressure inside your belly and force the hernia through the opening in your belly. Your health care provider may try to reduce the hernia by gently pushing the hernia back in. Imaging studies, such as an ultrasound or CT scan. How is this treated? This  condition is treated with surgery. If you have a strangulated hernia, surgery is done as soon as possible. If your hernia is small and not incarcerated, you may be asked to lose some weight before surgery. Follow these instructions at home: Eat and drink only as you've been told. Lose weight, if told by your provider. You may have to avoid lifting. Ask your provider how much you can safely lift. Avoid activities that increase pressure on your  hernia. Take your medicines only as told. You may need to take steps to help treat or prevent trouble pooping (constipation), such as: Taking medicines to help you poop. Eating foods high in fiber, like beans, whole grains, and fresh fruits and vegetables. Drinking more fluids as told. Ask your provider if it's safe to drive or use machines while taking your medicine. Contact a health care provider if: Your hernia gets larger or feels hard. Your hernia becomes painful. Get help right away if: Your hernia becomes very painful. You have pain along with any of these: Changes in skin color in the area of the hernia. Feeling like throwing up. Throwing up. Fever. These symptoms may be an emergency. Call 911 right away. Do not wait to see if the symptoms will go away. Do not drive yourself to the hospital. This information is not intended to replace advice given to you by your health care provider. Make sure you discuss any questions you have with your health care provider. Document Revised: 03/13/2023 Document Reviewed: 03/13/2023 Elsevier Patient Education  2024 [employer].

## 2024-04-30 ENCOUNTER — Ambulatory Visit: Payer: PRIVATE HEALTH INSURANCE | Admitting: Family Medicine

## 2024-05-01 ENCOUNTER — Ambulatory Visit
Admission: RE | Admit: 2024-05-01 | Discharge: 2024-05-01 | Disposition: A | Discharge status: Home / Self Care | Payer: PRIVATE HEALTH INSURANCE | Source: Ambulatory Visit | Attending: Surgery | Admitting: Surgery

## 2024-05-01 DIAGNOSIS — K439 Ventral hernia without obstruction or gangrene: Secondary | ICD-10-CM

## 2024-05-01 MED ORDER — IOPAMIDOL (ISOVUE-300) INJECTION 61%
70.0000 mL | Freq: Once | INTRAVENOUS | Status: AC | PRN
  Administered 2024-05-01: 70 mL via INTRAVENOUS

## 2024-05-05 ENCOUNTER — Encounter: Payer: Self-pay | Admitting: Sports Medicine

## 2024-05-06 ENCOUNTER — Other Ambulatory Visit (HOSPITAL_COMMUNITY): Payer: PRIVATE HEALTH INSURANCE

## 2024-05-08 ENCOUNTER — Encounter: Payer: Self-pay | Admitting: Surgery

## 2024-05-09 ENCOUNTER — Ambulatory Visit: Payer: PRIVATE HEALTH INSURANCE | Admitting: Surgery

## 2024-05-12 ENCOUNTER — Encounter: Payer: Self-pay | Admitting: Surgery

## 2024-05-12 ENCOUNTER — Ambulatory Visit: Payer: PRIVATE HEALTH INSURANCE | Admitting: Surgery

## 2024-05-12 ENCOUNTER — Ambulatory Visit (INDEPENDENT_AMBULATORY_CARE_PROVIDER_SITE_OTHER): Payer: PRIVATE HEALTH INSURANCE | Admitting: Surgery

## 2024-05-12 VITALS — BP 144/85 | HR 76 | Ht 60.0 in | Wt 89.0 lb

## 2024-05-12 DIAGNOSIS — R1013 Epigastric pain: Secondary | ICD-10-CM

## 2024-05-12 NOTE — Progress Notes (Signed)
 05/12/2024  History of Present Illness: Terry Paul is a 33 y.o. female presenting for follow up of a possible ventral hernia.  She was last seen on 04/28/24 and there appeared to be a possible hernia in the RUQ under the ribs.  She had a CT scan of abdomen and pelvis on 05/01/24 but on personal view of the CT images, there is no visible hernia defect.  The patient reports that she has not noticed much issue since our last visit.  She is wondering if this could have been a muscle spasm.  Reports that she's felt something similar on the LUQ but slightly smaller in size.    Past Medical History: Past Medical History:  Diagnosis Date   Preeclampsia    UTI (lower urinary tract infection)      Past Surgical History: Past Surgical History:  Procedure Laterality Date   CESAREAN SECTION N/A 04/26/2013   Procedure: Primary cesarean section  with delivery of twin  A girl at 41. Twin B girl at 37.;  Surgeon: Truman Corona, MD;  Location: [facility];  Service: Obstetrics;  Laterality: N/A;    Home Medications: Prior to Admission medications   Medication Sig Start Date End Date Taking? Authorizing Provider  ibuprofen  (ADVIL ) 200 MG tablet Take 200 mg by mouth every 6 (six) hours as needed.    [provider]  pregabalin  (LYRICA ) 75 MG capsule TAKE 1 CAPSULE BY MOUTH 2 TIMES A DAY 03/24/24   Brooks, Dana, DO  tiZANidine  (ZANAFLEX ) 4 MG tablet TAKE 1 TABLET BY MOUTH ONCE NIGHTLY AS DIRECTED 03/23/24   Claudene Arthea HERO, DO    Allergies: No Known Allergies  Review of Systems: Review of Systems  Constitutional:  Negative for chills and fever.  Respiratory:  Negative for shortness of breath.   Cardiovascular:  Negative for chest pain.  Gastrointestinal:  Negative for nausea and vomiting.    Physical Exam BP (!) 144/85   Pulse 76   Ht 5' (1.524 m)   Wt 89 lb (40.4 kg)   SpO2 100%   BMI 17.38 kg/m  CONSTITUTIONAL: No acute distress. HEENT:  Normocephalic, atraumatic, extraocular motion  intact. RESPIRATORY:  Normal respiratory effort without pathologic use of accessory muscles. CARDIOVASCULAR: Regular rhythm and rate. GI: The abdomen is soft, non-distended, non-tender to palpation.  The patient has in the high RUQ and LUQ near midline, two symmetric areas that seem to move/bulge when she coughs. These areas abut the rib/cartilage.  No specific hernia defect is palpable.  No hernia palpable at the umbilicus.  NEUROLOGIC:  Motor and sensation is grossly normal.  Cranial nerves are grossly intact. PSYCH:  Alert and oriented to person, place and time. Affect is normal.  Labs/Imaging: CT abdomen/pelvis on 05/01/24: IMPRESSION: No evidence of ventral abdominal wall hernia or mass.   Heterogeneous myometrial enhancement in the uterus, which may be due to adenomyosis or tiny fibroids.   2.2 cm benign-appearing left adnexal cyst, presumably physiologic in a reproductive age female. No follow-up imaging recommended. Reference: JACR 2020 Feb; 17(2):248-254  Assessment and Plan: This is a 33 y.o. female with possible ventral hernia.  --Discussed with the patient the findings on her CT scan.  On personal view of the images, there is no evidence of any hernia in the high epigastric or high RUQ and LUQ areas where the ribs meet the abdomen.  On exam, there is sensation of movement, but it's unclear that this is a hernia as no defect is palpable.  Also, it  would be unusual to have a hernia in a mirror like location from RUQ to LUQ.  Also discussed that a thoracic hernia or intercostal hernia would be very rare as well.   --Today, the patient reports that she has not had significant issues since I saw her with discomfort.  She's not seeing the bulging as much as before.  Since there is no convincing evidence for a hernia and there's no hernia on CT scan, for now discussed with her that we would monitor this area for any progression or worsening symptoms.  She's in agreement with this  plan. --Return precautions given.  Follow up as needed.  I spent 20 minutes dedicated to the care of this patient on the date of this encounter to include pre-visit review of records, face-to-face time with the patient discussing diagnosis and management, and any post-visit coordination of care.   Aloysius Sheree Plant, MD [facility]

## 2024-05-12 NOTE — Patient Instructions (Addendum)
Please call the office if you have any questions or concerns. 

## 2024-05-20 ENCOUNTER — Other Ambulatory Visit: Payer: Self-pay | Admitting: Sports Medicine

## 2024-05-20 ENCOUNTER — Encounter: Payer: PRIVATE HEALTH INSURANCE | Admitting: Family Medicine

## 2024-05-20 DIAGNOSIS — G8929 Other chronic pain: Secondary | ICD-10-CM

## 2024-05-20 DIAGNOSIS — G5782 Other specified mononeuropathies of left lower limb: Secondary | ICD-10-CM

## 2024-05-20 DIAGNOSIS — G5702 Lesion of sciatic nerve, left lower limb: Secondary | ICD-10-CM

## 2024-05-22 ENCOUNTER — Encounter: Payer: Self-pay | Admitting: Sports Medicine

## 2024-05-22 ENCOUNTER — Ambulatory Visit (INDEPENDENT_AMBULATORY_CARE_PROVIDER_SITE_OTHER): Payer: PRIVATE HEALTH INSURANCE | Admitting: Sports Medicine

## 2024-05-22 DIAGNOSIS — M791 Myalgia, unspecified site: Secondary | ICD-10-CM

## 2024-05-22 DIAGNOSIS — G5782 Other specified mononeuropathies of left lower limb: Secondary | ICD-10-CM

## 2024-05-22 DIAGNOSIS — G8929 Other chronic pain: Secondary | ICD-10-CM | POA: Diagnosis not present

## 2024-05-22 DIAGNOSIS — M7918 Myalgia, other site: Secondary | ICD-10-CM | POA: Diagnosis not present

## 2024-05-22 DIAGNOSIS — M545 Low back pain, unspecified: Secondary | ICD-10-CM

## 2024-05-22 MED ORDER — BETAMETHASONE SOD PHOS & ACET 6 (3-3) MG/ML IJ SUSP
3.0000 mg | INTRAMUSCULAR | Status: AC | PRN
  Administered 2024-05-22: 3 mg via INTRA_ARTICULAR

## 2024-05-22 MED ORDER — LIDOCAINE HCL 1 % IJ SOLN
1.0000 mL | INTRAMUSCULAR | Status: AC | PRN
  Administered 2024-05-22: 1 mL

## 2024-05-22 MED ORDER — BUPIVACAINE HCL 0.25 % IJ SOLN
1.0000 mL | INTRAMUSCULAR | Status: AC | PRN
  Administered 2024-05-22: 1 mL

## 2024-05-22 MED ORDER — PREGABALIN 75 MG PO CAPS: 75.0000 mg | ORAL_CAPSULE | Freq: Two times a day (BID) | ORAL | 1 refills | Status: DC

## 2024-05-22 NOTE — Progress Notes (Signed)
 Terry Paul - 33 y.o. female MRN [id number]  Date of birth: 1991-04-19  Office Visit Note: Visit Date: 05/22/2024 PCP: Terry Jeoffrey RAMAN, FNP Referred by: Terry Jeoffrey RAMAN, FNP  Subjective: Chief Complaint  Patient presents with   Left Leg - Follow-up, Pain   HPI: Terry Paul is a pleasant 33 y.o. female who presents today for follow-up of chronic left hip/buttock and leg pain.  Terry Paul still continues with somewhat bothersome left posterior buttock/hip/leg pain.  When I first started seeing her she was having more consistent numbness and tingling down the posterior leg which at times would go up and down to the foot.  This is still present at times but is less frequent in nature.  She still continues getting pain and discomfort in the buttock region and at the proximal hamstrings, this is worse when she sits.  When she is performing physical activity, this usually feels better.  She is using Lyrica  75 mg twice daily and states this does help, and when she misses doses that she will have more pain/discomfort.  She has had EMG/nerve conduction study which were within normal limits, recently had an MRI of the right femur to evaluate the hamstrings that did not show any evidence of bony or inflammatory tearing.  Note reviewed from Dr. Penne Paul, neurosurgery.  At this point, he was last concern for cluneal neuropathy and believe more of her issue was musculoskeletal.  Recommended trialing trigger point injections.  Pertinent ROS were reviewed with the patient and found to be negative unless otherwise specified above in HPI.   Assessment & Plan: Visit Diagnoses:  1. Left buttock pain   2. Myofascial pain on left side   3. Trigger point   4. Nerve entrapment of lower limb, left   5. 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  It is reassuring that her EMG/nerve conduction study does not show any damage or impingement of the sciatic nerve however.  We discussed trialing musculoskeletal trigger point injections for the posterior gluteal, piriformis, and the proximal hamstring origin today, these were performed without adverse effects, tolerated well. She will send me a mychart message in about 2 weeks to see what sort of relief she is having.  We will continue her Lyrica  75 mg twice daily.  I did discuss with Terry Paul the overall etiology of her pain which seems more muscular restriction/myofascial as she does have a very minimal scoliotic curve that produces a very mild functional leg length discrepancy.  When she describes her pain, she feels restriction in the muscle with activation of the buttock and proximal hamstrings, given this, I do think she would benefit from specialized soft tissue and dry needling treatments performed by Terry Paul, referral was sent today.  We also discussed trialing chiropractic treatment, I did provide a few recommendations for this as well but would like to start with her dry needling/soft tissue treatment first.   Follow-up: Return for Will send me mychart message in 2 weeks .   Meds & Orders:  Meds ordered this encounter  Medications   pregabalin  (LYRICA ) 75 MG capsule    Sig: Take 1 capsule (75 mg total) by mouth 2 (two) times daily.    Dispense:  60 capsule    Refill:  1  Orders Placed This Encounter  Procedures   Trigger Point Inj   Ambulatory referral to Physical Therapy     Procedures: Trigger Point Inj  Date/Time: 05/22/2024 11:14 AM  Performed by: Terry Brunet, DO Authorized by: Terry Brunet, DO   Consent Given by:  Patient Site marked: the procedure site was marked   Timeout: prior to procedure the correct patient, procedure, and site was verified   Indications:  Pain and therapeutic Total # of  Trigger Points:  3 or more Location: hip and lower extremity   Needle Size:  25 G Approach:  Dorsal Medications #1:  1 mL lidocaine  1 %; 1 mL bupivacaine  0.25 %; 3 mg betamethasone  acetate-betamethasone  sodium phosphate  6 (3-3) MG/ML Medications #2:  1 mL lidocaine  1 %; 1 mL bupivacaine  0.25 %; 3 mg betamethasone  acetate-betamethasone  sodium phosphate  6 (3-3) MG/ML Medications #3:  1 mL lidocaine  1 %; 1 mL bupivacaine  0.25 %; 3 mg betamethasone  acetate-betamethasone  sodium phosphate  6 (3-3) MG/ML Additional Injections?: No   Patient tolerance:  Patient tolerated the procedure well with no immediate complications Comments: Procedure: Trigger point injections (3), left buttock/hamstring After discussion on R/B/I and informed verbal consent was obtained, a timeout was conducted. The patient was placed in a prone position on the examination table and the area of maximal tenderness was identified over the posterior hip/buttock and proximal hamstring origin. This area was cleansed with Chloraprep and multiple alcohol swabs. Ethyl chloride was used for local anesthesia. Using a 25-gauge 1.5 inch needle the trigger point(s) was subsequently injected with a mixture of celestone  and 3 cc of 1% lidocaine  without epinephrine and 3 cc of bupivicaine 0.25%, with a total of 2.25 cc of injectate into each trigger point, three total. A band-aid was applied following. Patient tolerated procedure well, there were no post-injection complications. Post-procedure instructions were given.          Clinical History: No specialty comments available.  She reports that she has never smoked. She has never used smokeless tobacco. No results for input(s): HGBA1C, LABURIC in the last 8760 hours.  Objective:    Physical Exam  Gen: Well-appearing, in no acute distress; non-toxic CV: Well-perfused. Warm.  Resp: Breathing unlabored on room air; no wheezing. Psych: Fluid speech in conversation; appropriate affect; normal  thought process  Ortho Exam - Left buttock/leg: + Tenderness with associated trigger points over the superior gluteal musculature on the left, deep within the piriformis musculature and at the proximal hamstring origin just distal to the ischial tuberosity.  No redness swelling or effusion in this location.  There is mild restriction with pain with hamstring activation with donkey kick.  There is about a 1/8 leg length discrepancy when I like her prone from her minimal scoliosis, although not significant enough to warrant heel lift.  Imaging:  Narrative & Impression  CLINICAL DATA:  Chronic lower back pain.  Sacroiliac joint pain.   EXAM: BILATERAL SACROILIAC JOINTS - 3+ VIEW   COMPARISON:  AP pelvis 04/10/2023   FINDINGS: The bilateral sacroiliac, bilateral femoroacetabular joint spaces are maintained. Minimal pubic symphysis joint space narrowing and articular surface irregularity, osteoarthritis. No subchondral erosions. No acute fracture or dislocation.   IMPRESSION: Normal appearance of the bilateral sacroiliac joints.   Minimal pubic symphysis osteoarthritis.     Electronically Signed   By: Tanda Lyons M.D.   On: 01/10/2024 18:05   Narrative & Impression  EXAM DESCRIPTION: MR FEMUR LEFT WO CONTRAST   CLINICAL HISTORY: Leg pain, chronic (Ped 0-17y)  COMPARISON: None available   TECHNIQUE: Non contrast MRI of the brain is performed according to our usual protocol including multiplanar multi sequence technique.   FINDINGS: No fracture. No erosions. No significant degenerative change. The marrow signal is unremarkable. No significant joint effusion or bursal fluid. The visualized tendons and musculature are unremarkable. No subcutaneous edema.   IMPRESSION: Unremarkable exam.   Electronically signed by: Reyes Frees MD 03/07/2024 07:50 PM EDT RP Workstation: [id number]      Past Medical/Family/Surgical/Social History: Medications & Allergies reviewed  per EMR, new medications updated. 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 12/05/2023   Gluteal pain 12/05/2023   Somatic dysfunction of spine, sacral 02/05/2023   Piriformis syndrome of left side 11/20/2022   Irregular menses 06/12/2019   Underweight 06/12/2019   Family history of thyroid disease in mother 06/12/2019   Environmental and seasonal allergies 06/12/2019   Hypertension in pregnancy, postpartum condition 09/22/2013   Status post primary low transverse cesarean section 05/01/2013    Class: Status post   Twin pregnancy delivered 05/01/2013    Class: Status post   Twin pregnancy, twins discordant in third trimester 05/01/2013    Class: Present on Admission   Monochorionic diamniotic twin pregnancy 02/13/2013   Past Medical History:  Diagnosis Date   Preeclampsia    UTI (lower urinary tract infection)    Family History  Problem Relation Age of Onset   Healthy Mother    Healthy Father    Past Surgical History:  Procedure Laterality Date   CESAREAN SECTION N/A 04/26/2013   Procedure: Primary cesarean section  with delivery of twin  A girl at 84. Twin B girl at 42.;  Surgeon: Truman Corona, MD;  Location: [facility];  Service: Obstetrics;  Laterality: N/A;   Social History   Occupational History   Not on file  Tobacco Use   Smoking status: Never   Smokeless tobacco: Never  Vaping Use   Vaping status: Never Used  Substance and Sexual Activity   Alcohol use: Never   Drug use: No   Sexual activity: Yes    Partners: Male    Birth control/protection: Other-see comments    Comment: VAS

## 2024-05-22 NOTE — Progress Notes (Signed)
 Patient says she is been doing overall better than she was initially, although her pain and symptoms will get worse with prolonged sitting. She says that the Lyrica  does help, and she notices her pain increasing when it wears off towards the end of the day. She is here today to try trigger point injections.

## 2024-05-25 ENCOUNTER — Telehealth: Payer: Self-pay | Admitting: Family Medicine

## 2024-05-25 NOTE — Telephone Encounter (Signed)
 Prescription Request  05/25/2024  LOV: Visit date not found  What is the name of the medication or equipment? Retin a 0.025% cream   Have you contacted your pharmacy to request a refill? Yes   Which pharmacy would you like this sent to?  [facility] [id number] [place], [place] - [address] [facility] [address] [place] [address] Phone: [phone] Fax: [phone]    Patient notified that their request is being sent to the clinical staff for review and that they should receive a response within 2 business days.   Please advise at [facility] [phone]

## 2024-05-26 NOTE — Telephone Encounter (Signed)
 Retin a 0.025% cream not on current list.

## 2024-05-27 ENCOUNTER — Other Ambulatory Visit: Payer: Self-pay | Admitting: Family Medicine

## 2024-05-27 MED ORDER — TRETINOIN 0.025 % EX CREA: TOPICAL_CREAM | Freq: Every day | CUTANEOUS | 0 refills | Status: AC

## 2024-06-17 ENCOUNTER — Encounter: Payer: Self-pay | Admitting: Family Medicine

## 2024-06-17 ENCOUNTER — Ambulatory Visit (INDEPENDENT_AMBULATORY_CARE_PROVIDER_SITE_OTHER): Payer: PRIVATE HEALTH INSURANCE | Admitting: Family Medicine

## 2024-06-17 VITALS — BP 121/79 | HR 103 | Temp 97.9°F | Ht 60.0 in | Wt 89.8 lb

## 2024-06-17 DIAGNOSIS — M5116 Intervertebral disc disorders with radiculopathy, lumbar region: Secondary | ICD-10-CM

## 2024-06-17 DIAGNOSIS — H9192 Unspecified hearing loss, left ear: Secondary | ICD-10-CM | POA: Diagnosis not present

## 2024-06-17 DIAGNOSIS — Z0000 Encounter for general adult medical examination without abnormal findings: Secondary | ICD-10-CM | POA: Insufficient documentation

## 2024-06-17 DIAGNOSIS — Z13220 Encounter for screening for lipoid disorders: Secondary | ICD-10-CM

## 2024-06-17 DIAGNOSIS — Z0001 Encounter for general adult medical examination with abnormal findings: Secondary | ICD-10-CM

## 2024-06-17 LAB — COMPREHENSIVE METABOLIC PANEL WITH GFR
AG Ratio: 1.8 (calc) (ref 1.0–2.5)
ALT: 8 U/L (ref 6–29)
AST: 14 U/L (ref 10–30)
Albumin: 4.2 g/dL (ref 3.6–5.1)
Alkaline phosphatase (APISO): 65 U/L (ref 31–125)
BUN: 8 mg/dL (ref 7–25)
CO2: 29 mmol/L (ref 20–32)
Calcium: 9.5 mg/dL (ref 8.6–10.2)
Chloride: 103 mmol/L (ref 98–110)
Creat: 0.69 mg/dL (ref 0.50–0.97)
Globulin: 2.4 g/dL (ref 1.9–3.7)
Glucose, Bld: 85 mg/dL (ref 65–99)
Potassium: 4.2 mmol/L (ref 3.5–5.3)
Sodium: 138 mmol/L (ref 135–146)
Total Bilirubin: 0.4 mg/dL (ref 0.2–1.2)
Total Protein: 6.6 g/dL (ref 6.1–8.1)
eGFR: 116 mL/min/1.73m2 (ref >=60)

## 2024-06-17 LAB — CBC WITH DIFFERENTIAL/PLATELET
Absolute Lymphocytes: 920 {cells}/uL (ref 850–3900)
Absolute Monocytes: 315 {cells}/uL (ref 200–950)
Basophils Absolute: 30 {cells}/uL (ref 0–200)
Basophils Relative: 0.6 %
Eosinophils Absolute: 170 {cells}/uL (ref 15–500)
Eosinophils Relative: 3.4 %
HCT: 42.4 % (ref 35.0–45.0)
Hemoglobin: 13.9 g/dL (ref 11.7–15.5)
MCH: 29.4 pg (ref 27.0–33.0)
MCHC: 32.8 g/dL (ref 32.0–36.0)
MCV: 89.6 fL (ref 80.0–100.0)
MPV: 10.3 fL (ref 7.5–12.5)
Monocytes Relative: 6.3 %
Neutro Abs: 3565 {cells}/uL (ref 1500–7800)
Neutrophils Relative %: 71.3 %
Platelets: 257 Thousand/uL (ref 140–400)
RBC: 4.73 Million/uL (ref 3.80–5.10)
RDW: 12.4 % (ref 11.0–15.0)
Total Lymphocyte: 18.4 %
WBC: 5 Thousand/uL (ref 3.8–10.8)

## 2024-06-17 LAB — LIPID PANEL
Cholesterol: 169 mg/dL (ref <200)
HDL: 78 mg/dL (ref >=50)
LDL Cholesterol (Calc): 74 mg/dL
Non-HDL Cholesterol (Calc): 91 mg/dL (ref <130)
Total CHOL/HDL Ratio: 2.2 (calc) (ref <5.0)
Triglycerides: 91 mg/dL (ref <150)

## 2024-06-17 NOTE — Patient Instructions (Signed)
 Hearing Screening   500Hz  1000Hz  2000Hz  3000Hz  4000Hz  6000Hz  8000Hz   Right ear 20 20 20 25 25 25 25   Left ear 20 20 20 30  45 30 20

## 2024-06-17 NOTE — Assessment & Plan Note (Signed)
 Hearing Screening   500Hz  1000Hz  2000Hz  3000Hz  4000Hz  6000Hz  8000Hz   Right ear 20 20 20 25 25 25 25   Left ear 20 20 20 30  45 30 20   Concerns of hearing loss. No trauma or exposure. Poor hearing in both parents. Referral to audiology.

## 2024-06-17 NOTE — Progress Notes (Signed)
 Complete physical exam  Patient: Terry Paul   DOB: 04-Dec-1990   33 y.o. Female  MRN: [id number]  Subjective:    Chief Complaint  Patient presents with   Annual Exam    Congested since flu shot this past Friday. Wants to get pap thru OBGYN     Terry Paul is a 33 y.o. female who presents today for a complete physical exam. She reports consuming a general diet. pilates She generally feels well. She reports sleeping well. She does not have additional problems to discuss today.  PMH includes left hip sciatica. She is being followed by Ortho. Has received steroid injections for this with some relief. Continues to take Lyrica  and Tizanidine  for this.   Most recent fall risk assessment:    02/13/2024    2:06 PM  Fall Risk   Falls in the past year? 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 12/05/2023   Gluteal pain 12/05/2023   Somatic dysfunction of spine, sacral 02/05/2023   Piriformis syndrome of left side 11/20/2022   Irregular menses 06/12/2019   Underweight 06/12/2019   Family history of thyroid disease in mother 06/12/2019   Environmental and seasonal allergies 06/12/2019   Status post primary low transverse cesarean section 05/01/2013    Class: Status post   Twin pregnancy delivered 05/01/2013    Class: Status post   Twin pregnancy, twins discordant in third trimester 05/01/2013    Class: Present on Admission   Monochorionic diamniotic twin pregnancy 02/13/2013   Past Medical History:  Diagnosis Date   Preeclampsia     UTI (lower urinary tract infection)    Past Surgical History:  Procedure Laterality Date   CESAREAN SECTION N/A 04/26/2013   Procedure: Primary cesarean section  with delivery of twin  A girl at 60. Twin B girl at 18.;  Surgeon: Truman Corona, MD;  Location: [facility];  Service: Obstetrics;  Laterality: N/A;   Social History   Tobacco Use   Smoking status: Never   Smokeless tobacco: Never  Vaping Use   Vaping status: Never Used  Substance Use Topics   Alcohol use: Never   Drug use: No   Family History  Problem Relation Age of Onset   Healthy Mother    Healthy Father    Polycystic ovary syndrome Sister    Polycystic ovary syndrome Sister    No Known Allergies    Patient Care Team: Kayla Jeoffrey RAMAN, FNP as PCP - General (Family Medicine)   Outpatient Medications Prior to Visit  Medication Sig   ibuprofen  (ADVIL ) 200 MG tablet Take 200 mg by mouth every 6 (six) hours as needed.   pregabalin  (LYRICA ) 75 MG capsule Take 1 capsule (75 mg total) by mouth 2 (two) times daily.   tiZANidine  (ZANAFLEX ) 4 MG tablet TAKE 1 TABLET BY MOUTH ONCE NIGHTLY AS DIRECTED   tretinoin  (RETIN-A ) 0.025 % cream Apply topically at bedtime.   No facility-administered medications prior to visit.  Review of Systems  Constitutional: Negative.   HENT:  Positive for hearing loss.   Eyes: Negative.   Respiratory: Negative.    Cardiovascular: Negative.   Gastrointestinal: Negative.   Genitourinary: Negative.   Musculoskeletal:  Positive for joint pain.  Skin: Negative.   Neurological: Negative.   Endo/Heme/Allergies: Negative.   Psychiatric/Behavioral: Negative.    All other systems reviewed and are negative.         Objective:     BP 121/79   Pulse (!) 103   Temp 97.9 F (36.6 C)   Ht 5' (1.524 m)   Wt 89 lb 12.8 oz (40.7 kg)   LMP 06/14/2024 (Exact Date)   SpO2 100%   BMI 17.54 kg/m  BP Readings from Last 3 Encounters:  06/17/24 121/79  05/12/24 (!) 144/85  04/28/24  (!) 174/71   Wt Readings from Last 3 Encounters:  06/17/24 89 lb 12.8 oz (40.7 kg)  05/12/24 89 lb (40.4 kg)  04/28/24 92 lb 6.4 oz (41.9 kg)      Physical Exam Vitals and nursing note reviewed.  Constitutional:      Appearance: Normal appearance. She is normal weight.  HENT:     Head: Normocephalic and atraumatic.     Right Ear: Tympanic membrane, ear canal and external ear normal.     Left Ear: Tympanic membrane, ear canal and external ear normal.     Nose: Nose normal.     Mouth/Throat:     Mouth: Mucous membranes are moist.     Pharynx: Oropharynx is clear.  Eyes:     Extraocular Movements: Extraocular movements intact.     Conjunctiva/sclera: Conjunctivae normal.     Pupils: Pupils are equal, round, and reactive to light.  Cardiovascular:     Rate and Rhythm: Normal rate and regular rhythm.     Pulses: Normal pulses.     Heart sounds: Normal heart sounds.  Pulmonary:     Effort: Pulmonary effort is normal.     Breath sounds: Normal breath sounds.  Abdominal:     General: Bowel sounds are normal.     Palpations: Abdomen is soft.  Musculoskeletal:        General: Normal range of motion.     Cervical back: Normal range of motion and neck supple.  Skin:    General: Skin is warm and dry.     Capillary Refill: Capillary refill takes less than 2 seconds.  Neurological:     General: No focal deficit present.     Mental Status: She is alert and oriented to person, place, and time. Mental status is at baseline.  Psychiatric:        Mood and Affect: Mood normal.        Behavior: Behavior normal.        Thought Content: Thought content normal.        Judgment: Judgment normal.      No results found for any visits on 06/17/24. Last CBC Lab Results  Component Value Date   WBC 4.2 06/12/2019   HGB 13.7 06/12/2019   HCT 39.7 06/12/2019   MCV 86 06/12/2019   MCH 29.7 06/12/2019   RDW 12.3 06/12/2019   PLT 293 06/12/2019   Last metabolic panel Lab Results   Component Value Date   GLUCOSE 93 06/12/2019   NA 139 06/12/2019   K 4.0 06/12/2019   CL 100 06/12/2019   CO2 25 06/12/2019   BUN 9 06/12/2019   CREATININE 0.64 06/12/2019  GFRNONAA 111 01/05/2023   CALCIUM 9.9 04/02/2023   PROT 7.3 06/12/2019   ALBUMIN 4.8 06/12/2019   LABGLOB 2.5 06/12/2019   AGRATIO 1.9 06/12/2019   BILITOT 0.3 06/12/2019   ALKPHOS 68 06/12/2019   AST 16 06/12/2019   ALT 11 06/12/2019   Last lipids No results found for: CHOL, HDL, LDLCALC, LDLDIRECT, TRIG, CHOLHDL Last hemoglobin A1c No results found for: HGBA1C Last thyroid functions Lab Results  Component Value Date   TSH 1.360 06/12/2019   Last vitamin D  Lab Results  Component Value Date   VD25OH 41.36 04/02/2023   Last vitamin B12 and Folate No results found for: VITAMINB12, FOLATE   Hearing Screening   500Hz  1000Hz  2000Hz  3000Hz  4000Hz  6000Hz  8000Hz   Right ear 20 20 20 25 25 25 25   Left ear 20 20 20 30  45 30 20       Assessment & Plan:    Routine Health Maintenance and Physical Exam  Immunization History  Administered Date(s) Administered   Influenza Split 05/19/2013   Influenza,inj,Quad PF,6+ Mos 07/18/2022   Influenza-Unspecified 08/08/2015, 06/13/2023, 06/13/2024    Health Maintenance  Topic Date Due   Hepatitis C Screening  Never done   Hepatitis B Vaccines 19-59 Average Risk (1 of 3 - 19+ 3-dose series) Never done   HPV VACCINES (1 - 3-dose SCDM series) Never done   Cervical Cancer Screening (HPV/Pap Cotest)  10/31/2018   COVID-19 Vaccine (1 - 2024-25 season) 07/03/2024 (Originally 05/19/2024)   Influenza Vaccine  Completed   HIV Screening  Completed   Pneumococcal Vaccine  Aged Out   Meningococcal B Vaccine  Aged Out   DTaP/Tdap/Td  Discontinued    Discussed health benefits of physical activity, and encouraged her to engage in regular exercise appropriate for her age and condition.  Problem List Items Addressed This Visit     Radiculopathy due to  disorder of intervertebral disc of lumbar spine   Followed by Ortho. Received trigger point injections and on Lyrica  and Tizanidine .  EMG/nerve conduction study were within normal limits MRI of the right femur to evaluate the hamstrings that did not show any evidence of bony or inflammatory tearing.       Physical exam, annual - Primary   Today your medical history was reviewed and routine physical exam with labs was performed. Recommend 150 minutes of moderate intensity exercise weekly and consuming a well-balanced diet. Advised to stop smoking if a smoker, avoid smoking if a non-smoker, limit alcohol consumption to 1 drink per day for women and 2 drinks per day for men, and avoid illicit drug use. Counseled in mental health awareness and when to seek medical care. Vaccine maintenance discussed. Appropriate health maintenance items reviewed. Return to office in 1 year for annual physical exam.       Relevant Orders   CBC with Differential/Platelet   Comprehensive metabolic panel with GFR   Lipid panel   Hearing loss of left ear   Hearing Screening   500Hz  1000Hz  2000Hz  3000Hz  4000Hz  6000Hz  8000Hz   Right ear 20 20 20 25 25 25 25   Left ear 20 20 20 30  45 30 20   Concerns of hearing loss. No trauma or exposure. Poor hearing in both parents. Referral to audiology.      Other Visit Diagnoses       Screening for lipoid disorders       Relevant Orders   Lipid panel      Return in about 1 year (around 06/17/2025) for  annual physical with labs 1 week prior.     Jeoffrey GORMAN Barrio, FNP

## 2024-06-17 NOTE — Assessment & Plan Note (Addendum)
 Followed by Ortho. Received trigger point injections and on Lyrica  and Tizanidine .  EMG/nerve conduction study were within normal limits MRI of the right femur to evaluate the hamstrings that did not show any evidence of bony or inflammatory tearing.

## 2024-06-17 NOTE — Assessment & Plan Note (Signed)

## 2024-07-17 ENCOUNTER — Other Ambulatory Visit: Payer: Self-pay | Admitting: Sports Medicine

## 2024-07-17 DIAGNOSIS — G8929 Other chronic pain: Secondary | ICD-10-CM

## 2024-07-17 DIAGNOSIS — M791 Myalgia, unspecified site: Secondary | ICD-10-CM

## 2024-07-17 DIAGNOSIS — G5782 Other specified mononeuropathies of left lower limb: Secondary | ICD-10-CM

## 2024-07-18 ENCOUNTER — Encounter: Payer: Self-pay | Admitting: Family Medicine

## 2024-07-18 ENCOUNTER — Other Ambulatory Visit: Payer: Self-pay | Admitting: Sports Medicine

## 2024-07-18 DIAGNOSIS — G5782 Other specified mononeuropathies of left lower limb: Secondary | ICD-10-CM

## 2024-07-18 DIAGNOSIS — G8929 Other chronic pain: Secondary | ICD-10-CM

## 2024-07-18 DIAGNOSIS — M791 Myalgia, unspecified site: Secondary | ICD-10-CM

## 2024-07-21 ENCOUNTER — Encounter: Payer: Self-pay | Admitting: Radiology

## 2024-07-22 ENCOUNTER — Other Ambulatory Visit: Payer: Self-pay | Admitting: Family Medicine

## 2024-07-22 DIAGNOSIS — G8929 Other chronic pain: Secondary | ICD-10-CM

## 2024-07-22 DIAGNOSIS — G5782 Other specified mononeuropathies of left lower limb: Secondary | ICD-10-CM

## 2024-07-22 DIAGNOSIS — M791 Myalgia, unspecified site: Secondary | ICD-10-CM

## 2024-07-28 ENCOUNTER — Encounter: Payer: PRIVATE HEALTH INSURANCE | Admitting: Family Medicine

## 2024-08-18 ENCOUNTER — Other Ambulatory Visit: Payer: Self-pay | Admitting: Sports Medicine

## 2024-08-18 DIAGNOSIS — M791 Myalgia, unspecified site: Secondary | ICD-10-CM

## 2024-08-18 DIAGNOSIS — G8929 Other chronic pain: Secondary | ICD-10-CM

## 2024-08-18 DIAGNOSIS — G5782 Other specified mononeuropathies of left lower limb: Secondary | ICD-10-CM

## 2024-08-20 ENCOUNTER — Telehealth: Payer: Self-pay

## 2024-08-20 ENCOUNTER — Other Ambulatory Visit: Payer: Self-pay | Admitting: Family Medicine

## 2024-08-22 NOTE — Telephone Encounter (Signed)
 Medication has been sent in by another provider.

## 2024-09-29 ENCOUNTER — Other Ambulatory Visit: Payer: Self-pay | Admitting: Family Medicine

## 2024-09-30 ENCOUNTER — Encounter: Payer: Self-pay | Admitting: Family Medicine

## 2024-09-30 ENCOUNTER — Other Ambulatory Visit: Payer: Self-pay | Admitting: Family Medicine

## 2024-10-01 NOTE — Telephone Encounter (Signed)
 Prescription Request  10/01/2024  LOV: 06/17/2024  What is the name of the medication or equipment?   tiZANidine  (ZANAFLEX ) 4 MG tablet   Have you contacted your pharmacy to request a refill? Yes   Which pharmacy would you like this sent to?  [facility] [id number] [place], [place] - [address] [facility] [address] [place] [address] Phone: [phone] Fax: [phone]    Patient notified that their request is being sent to the clinical staff for review and that they should receive a response within 2 business days.   Please advise patient.

## 2024-10-02 ENCOUNTER — Other Ambulatory Visit: Payer: Self-pay | Admitting: Family Medicine

## 2024-10-03 ENCOUNTER — Other Ambulatory Visit: Payer: Self-pay | Admitting: Sports Medicine

## 2024-10-03 ENCOUNTER — Encounter: Payer: Self-pay | Admitting: Sports Medicine

## 2024-10-03 MED ORDER — TIZANIDINE HCL 4 MG PO TABS: 4.0000 mg | ORAL_TABLET | Freq: Every day | ORAL | 1 refills | Status: AC
